# Patient Record
Sex: Female | Born: 1960 | Race: White | Hispanic: No | Marital: Married | Smoking: Never smoker
Health system: Southern US, Community
[De-identification: ages and names within clinical notes are randomized; demographics above are authoritative.]

## PROBLEM LIST (undated history)

## (undated) DIAGNOSIS — F32A Depression, unspecified: Secondary | ICD-10-CM

## (undated) DIAGNOSIS — F329 Major depressive disorder, single episode, unspecified: Secondary | ICD-10-CM

## (undated) DIAGNOSIS — T7840XA Allergy, unspecified, initial encounter: Secondary | ICD-10-CM

## (undated) DIAGNOSIS — N200 Calculus of kidney: Secondary | ICD-10-CM

## (undated) DIAGNOSIS — E785 Hyperlipidemia, unspecified: Secondary | ICD-10-CM

## (undated) HISTORY — DX: Depression, unspecified: F32.A

## (undated) HISTORY — DX: Allergy, unspecified, initial encounter: T78.40XA

## (undated) HISTORY — DX: Hyperlipidemia, unspecified: E78.5

---
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
## 1987-08-08 HISTORY — PX: OTHER SURGICAL HISTORY: SHX169

## 2009-08-07 DIAGNOSIS — N200 Calculus of kidney: Secondary | ICD-10-CM | POA: Insufficient documentation

## 2009-08-07 HISTORY — DX: Calculus of kidney: N20.0

## 2010-01-05 HISTORY — PX: COLONOSCOPY: SHX174

## 2012-08-07 HISTORY — PX: OTHER SURGICAL HISTORY: SHX169

## 2012-12-13 ENCOUNTER — Ambulatory Visit (INDEPENDENT_AMBULATORY_CARE_PROVIDER_SITE_OTHER): Payer: Self-pay | Admitting: Internal Medicine

## 2012-12-13 DIAGNOSIS — Z23 Encounter for immunization: Secondary | ICD-10-CM

## 2012-12-13 DIAGNOSIS — Z789 Other specified health status: Secondary | ICD-10-CM

## 2012-12-13 MED ORDER — AZITHROMYCIN 500 MG PO TABS: 500.0000 mg | ORAL_TABLET | Freq: Every day | ORAL | Status: DC

## 2012-12-13 MED ORDER — ATOVAQUONE-PROGUANIL HCL 250-100 MG PO TABS: 1.0000 | ORAL_TABLET | Freq: Every day | ORAL | Status: DC

## 2012-12-13 MED ORDER — TYPHOID VACCINE PO CPDR: 1.0000 | DELAYED_RELEASE_CAPSULE | ORAL | Status: DC

## 2012-12-13 NOTE — Progress Notes (Signed)
[facility] TRAVEL CLINIC NOTE  RFV: travel to [place] Subjective:    Patient ID: Laurie Rangel, female    DOB: [birth year], 52 y.o.   MRN: [id number]  HPI Ahmani going with her family to [place], from July 20th thru Aug 5th for family vacation.  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  Review of Systems     Objective:   Physical Exam        Assessment & Plan:  Provided travel counseling plus: Traveler's diarrhea = give rx for azithro 500mg  daily(#10) NR Malaria proph = malarone #26 tabs to start July 18th take daily until 1 wk return Pre travel vaccinations = she will take the oral vivotif and also receive Yellow fever

## 2012-12-27 ENCOUNTER — Encounter: Payer: Self-pay | Admitting: Internal Medicine

## 2014-02-01 DIAGNOSIS — J309 Allergic rhinitis, unspecified: Secondary | ICD-10-CM | POA: Insufficient documentation

## 2014-02-01 DIAGNOSIS — H6980 Other specified disorders of Eustachian tube, unspecified ear: Secondary | ICD-10-CM | POA: Insufficient documentation

## 2014-02-27 ENCOUNTER — Ambulatory Visit (INDEPENDENT_AMBULATORY_CARE_PROVIDER_SITE_OTHER): Payer: BC Managed Care – PPO | Admitting: Internal Medicine

## 2014-02-27 DIAGNOSIS — G4725 Circadian rhythm sleep disorder, jet lag type: Secondary | ICD-10-CM

## 2014-02-27 DIAGNOSIS — A09 Infectious gastroenteritis and colitis, unspecified: Secondary | ICD-10-CM

## 2014-02-27 DIAGNOSIS — Z9189 Other specified personal risk factors, not elsewhere classified: Secondary | ICD-10-CM

## 2014-02-27 MED ORDER — AZITHROMYCIN 500 MG PO TABS: 500.0000 mg | ORAL_TABLET | Freq: Every day | ORAL | Status: DC

## 2014-02-27 MED ORDER — ZOLPIDEM TARTRATE 5 MG PO TABS: 5.0000 mg | ORAL_TABLET | Freq: Every evening | ORAL | Status: DC | PRN

## 2014-02-27 MED ORDER — ATOVAQUONE-PROGUANIL HCL 250-100 MG PO TABS: 1.0000 | ORAL_TABLET | Freq: Every day | ORAL | Status: DC

## 2014-02-27 NOTE — Progress Notes (Signed)
   Subjective:    Patient ID: Laurie Rangel, female    DOB: 01/30/1961, 53 y.o.   MRN: [id number]  HPI Laurie Rangel is a 53yo F who traveled to [place] last year, is about to go to [place] for 10 days leaving august 23rd adn returning sept 3rd. She has done well on her past travels, no recent illness. She has also gone to [place] and not had any episode of traveler diarrhea. She did pick up medication in [place] for jetlag that is heavily sedating  Imms: uptodate    Review of Systems     Objective:   Physical Exam        Assessment & Plan:  Pre-travel vaccinations = up to date for this itinerary. No need for boosters at this time  Malaria prophylaxis = gave malarone, and precautions for mosquito bites/prevention  Traveler's diarrhea= gave rx for azithromycin and tips sheet  Jet lag = gave rx for ambien 5mg  #6, since she has tolerated in the past. Asked her to stop taking medication from [place].

## 2014-09-18 ENCOUNTER — Ambulatory Visit (INDEPENDENT_AMBULATORY_CARE_PROVIDER_SITE_OTHER): Payer: BLUE CROSS/BLUE SHIELD | Admitting: Internal Medicine

## 2014-09-18 DIAGNOSIS — Z9189 Other specified personal risk factors, not elsewhere classified: Secondary | ICD-10-CM

## 2014-09-18 MED ORDER — PROMETHAZINE HCL 25 MG PO TABS: 25.0000 mg | ORAL_TABLET | Freq: Four times a day (QID) | ORAL | Status: DC | PRN

## 2014-09-18 MED ORDER — CIPROFLOXACIN HCL 500 MG PO TABS: 500.0000 mg | ORAL_TABLET | Freq: Two times a day (BID) | ORAL | Status: DC

## 2014-09-18 MED ORDER — ATOVAQUONE-PROGUANIL HCL 250-100 MG PO TABS: 1.0000 | ORAL_TABLET | Freq: Every day | ORAL | Status: DC

## 2014-09-18 NOTE — Progress Notes (Signed)
   Subjective:    Patient ID: Laurie Rangel, female    DOB: 08/02/1961, 54 y.o.   MRN: [id number]  HPI 54yo F who has extensive travel history, who is returning to [place] in April for 10 day trip, leaving April 19 th - 29th. 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 On her last trip, she did have a bout of traveler's diarrhea that she attributes to indiscretion with food safety when she participated at a wedding reception. No one else was sick in her group.  uptodate on travel vaccine, flu  Allergies  Allergen Reactions  . Penicillins Hives   Current Outpatient Prescriptions on File Prior to Visit  Medication Sig Dispense Refill  . azithromycin (ZITHROMAX) 500 MG tablet Take 1 tablet (500 mg total) by mouth daily. If needed for 3 or more loose stools per day. Can stop if diarrhea resolves 10 tablet 0  . zolpidem (AMBIEN) 5 MG tablet Take 1 tablet (5 mg total) by mouth at bedtime as needed for sleep. 6 tablet 0   No current facility-administered medications on file prior to visit.      Review of Systems     Objective:   Physical Exam        Assessment & Plan:  Pre travel counseling  Traveler's diarrhea = gave her rx for cipro if needed, also phenergan  Malaria prophylaxis = gave her rx for malarone #21 for her trip. To start 2 days prior to travel, daily plus 7 days on return. To also use mosquito repellant and premethrin spray for clothing

## 2015-11-16 DIAGNOSIS — R6889 Other general symptoms and signs: Secondary | ICD-10-CM | POA: Diagnosis not present

## 2016-01-31 DIAGNOSIS — M859 Disorder of bone density and structure, unspecified: Secondary | ICD-10-CM | POA: Diagnosis not present

## 2016-01-31 DIAGNOSIS — Z0000 Encounter for general adult medical examination without abnormal findings: Secondary | ICD-10-CM | POA: Diagnosis not present

## 2016-02-14 DIAGNOSIS — N390 Urinary tract infection, site not specified: Secondary | ICD-10-CM | POA: Diagnosis not present

## 2016-02-14 DIAGNOSIS — Z0000 Encounter for general adult medical examination without abnormal findings: Secondary | ICD-10-CM | POA: Diagnosis not present

## 2016-02-14 DIAGNOSIS — Z1389 Encounter for screening for other disorder: Secondary | ICD-10-CM | POA: Diagnosis not present

## 2016-02-14 DIAGNOSIS — E559 Vitamin D deficiency, unspecified: Secondary | ICD-10-CM | POA: Diagnosis not present

## 2016-02-14 DIAGNOSIS — C439 Malignant melanoma of skin, unspecified: Secondary | ICD-10-CM | POA: Diagnosis not present

## 2016-02-14 DIAGNOSIS — H9012 Conductive hearing loss, unilateral, left ear, with unrestricted hearing on the contralateral side: Secondary | ICD-10-CM | POA: Diagnosis not present

## 2016-02-14 DIAGNOSIS — M859 Disorder of bone density and structure, unspecified: Secondary | ICD-10-CM | POA: Diagnosis not present

## 2016-02-17 DIAGNOSIS — Z1212 Encounter for screening for malignant neoplasm of rectum: Secondary | ICD-10-CM | POA: Diagnosis not present

## 2016-04-07 ENCOUNTER — Telehealth: Payer: Self-pay | Admitting: Gastroenterology

## 2016-04-07 NOTE — Telephone Encounter (Signed)
Received referral from Dr. Wylene Simmerisovec office for colonoscopy. Patient states last colon was in 2011 in [place]. Patient states that Dr. Wylene Simmerisovec told her that he "doesn't trust" the [place] colonoscopy report and he would like patient to be seen for another colonoscopy as soon as possible. Records placed on Dr. Larae GroomsJacob's desk for review.

## 2016-04-12 NOTE — Telephone Encounter (Signed)
Dr. Christella HartiganJacobs reviewed records and has accepted patient. Next colon 01/2020. I informed patient that we will contact her when time to schedule and to call our office if she needs us before then. Report sent to scan.

## 2016-05-01 DIAGNOSIS — H2513 Age-related nuclear cataract, bilateral: Secondary | ICD-10-CM | POA: Diagnosis not present

## 2016-05-01 DIAGNOSIS — H11153 Pinguecula, bilateral: Secondary | ICD-10-CM | POA: Diagnosis not present

## 2016-05-04 DIAGNOSIS — L578 Other skin changes due to chronic exposure to nonionizing radiation: Secondary | ICD-10-CM | POA: Diagnosis not present

## 2016-05-04 DIAGNOSIS — D1801 Hemangioma of skin and subcutaneous tissue: Secondary | ICD-10-CM | POA: Diagnosis not present

## 2016-05-04 DIAGNOSIS — D225 Melanocytic nevi of trunk: Secondary | ICD-10-CM | POA: Diagnosis not present

## 2016-05-04 DIAGNOSIS — L821 Other seborrheic keratosis: Secondary | ICD-10-CM | POA: Diagnosis not present

## 2016-10-02 DIAGNOSIS — Z1231 Encounter for screening mammogram for malignant neoplasm of breast: Secondary | ICD-10-CM | POA: Diagnosis not present

## 2016-10-02 DIAGNOSIS — Z01419 Encounter for gynecological examination (general) (routine) without abnormal findings: Secondary | ICD-10-CM | POA: Diagnosis not present

## 2016-10-02 DIAGNOSIS — Z6828 Body mass index (BMI) 28.0-28.9, adult: Secondary | ICD-10-CM | POA: Diagnosis not present

## 2016-11-30 DIAGNOSIS — L821 Other seborrheic keratosis: Secondary | ICD-10-CM | POA: Diagnosis not present

## 2016-11-30 DIAGNOSIS — L237 Allergic contact dermatitis due to plants, except food: Secondary | ICD-10-CM | POA: Diagnosis not present

## 2017-02-22 DIAGNOSIS — N390 Urinary tract infection, site not specified: Secondary | ICD-10-CM | POA: Diagnosis not present

## 2017-02-22 DIAGNOSIS — Z0000 Encounter for general adult medical examination without abnormal findings: Secondary | ICD-10-CM | POA: Diagnosis not present

## 2017-02-22 DIAGNOSIS — M859 Disorder of bone density and structure, unspecified: Secondary | ICD-10-CM | POA: Diagnosis not present

## 2017-02-22 DIAGNOSIS — E559 Vitamin D deficiency, unspecified: Secondary | ICD-10-CM | POA: Diagnosis not present

## 2017-03-02 DIAGNOSIS — Z1389 Encounter for screening for other disorder: Secondary | ICD-10-CM | POA: Diagnosis not present

## 2017-03-02 DIAGNOSIS — Z0000 Encounter for general adult medical examination without abnormal findings: Secondary | ICD-10-CM | POA: Diagnosis not present

## 2017-03-02 DIAGNOSIS — E559 Vitamin D deficiency, unspecified: Secondary | ICD-10-CM | POA: Diagnosis not present

## 2017-03-02 DIAGNOSIS — H9012 Conductive hearing loss, unilateral, left ear, with unrestricted hearing on the contralateral side: Secondary | ICD-10-CM | POA: Diagnosis not present

## 2017-03-02 DIAGNOSIS — M859 Disorder of bone density and structure, unspecified: Secondary | ICD-10-CM | POA: Diagnosis not present

## 2017-03-02 DIAGNOSIS — Z6828 Body mass index (BMI) 28.0-28.9, adult: Secondary | ICD-10-CM | POA: Diagnosis not present

## 2017-03-02 DIAGNOSIS — F331 Major depressive disorder, recurrent, moderate: Secondary | ICD-10-CM | POA: Diagnosis not present

## 2017-03-09 DIAGNOSIS — Z1212 Encounter for screening for malignant neoplasm of rectum: Secondary | ICD-10-CM | POA: Diagnosis not present

## 2017-03-16 ENCOUNTER — Ambulatory Visit (INDEPENDENT_AMBULATORY_CARE_PROVIDER_SITE_OTHER): Payer: BLUE CROSS/BLUE SHIELD | Admitting: Internal Medicine

## 2017-03-16 DIAGNOSIS — Z9189 Other specified personal risk factors, not elsewhere classified: Secondary | ICD-10-CM

## 2017-03-16 DIAGNOSIS — A09 Infectious gastroenteritis and colitis, unspecified: Secondary | ICD-10-CM

## 2017-03-16 MED ORDER — DOXYCYCLINE HYCLATE 100 MG PO TABS: 100.0000 mg | ORAL_TABLET | Freq: Every day | ORAL | 0 refills | Status: DC

## 2017-03-16 MED ORDER — AZITHROMYCIN 500 MG PO TABS: 500.0000 mg | ORAL_TABLET | Freq: Every day | ORAL | 0 refills | Status: DC

## 2017-03-16 MED ORDER — ATOVAQUONE-PROGUANIL HCL 250-100 MG PO TABS: 1.0000 | ORAL_TABLET | Freq: Every day | ORAL | 0 refills | Status: DC

## 2017-03-16 NOTE — Progress Notes (Signed)
Subjective:   Lindaann SloughBetty Palmer is a 56 y.o. female who presents to the Infectious Disease clinic for travel consultation. Planned departure date: Sept -end Planned return date: 3 day trip Countries of travel: [place] Areas in country: urban and rural   Accommodations: hotel Purpose of travel: philanthropy Prior travel out of [place]: [place], [place], [place]     Objective:   Medications: promethazine    Assessment:   No contraindications to travel. none   Plan:    pre travel vaccinations are uptodate  Malaria proph = recommended malarone, also gave rx of doxycycline if she could not tolerate malarone, to fill while in [place]  Traveler's diarrhea = gave rx for azithromcyin to use if needed  Mosquito bite prevention = gave recs for deet, and premethrin

## 2017-03-24 DIAGNOSIS — N390 Urinary tract infection, site not specified: Secondary | ICD-10-CM | POA: Diagnosis not present

## 2017-03-29 DIAGNOSIS — Z1211 Encounter for screening for malignant neoplasm of colon: Secondary | ICD-10-CM | POA: Diagnosis not present

## 2017-03-29 DIAGNOSIS — Z1212 Encounter for screening for malignant neoplasm of rectum: Secondary | ICD-10-CM | POA: Diagnosis not present

## 2017-04-12 DIAGNOSIS — N390 Urinary tract infection, site not specified: Secondary | ICD-10-CM | POA: Diagnosis not present

## 2017-05-15 DIAGNOSIS — L578 Other skin changes due to chronic exposure to nonionizing radiation: Secondary | ICD-10-CM | POA: Diagnosis not present

## 2017-05-15 DIAGNOSIS — L821 Other seborrheic keratosis: Secondary | ICD-10-CM | POA: Diagnosis not present

## 2017-05-15 DIAGNOSIS — D1801 Hemangioma of skin and subcutaneous tissue: Secondary | ICD-10-CM | POA: Diagnosis not present

## 2017-05-15 DIAGNOSIS — D225 Melanocytic nevi of trunk: Secondary | ICD-10-CM | POA: Diagnosis not present

## 2017-06-26 DIAGNOSIS — R21 Rash and other nonspecific skin eruption: Secondary | ICD-10-CM | POA: Diagnosis not present

## 2017-07-19 DIAGNOSIS — D485 Neoplasm of uncertain behavior of skin: Secondary | ICD-10-CM | POA: Diagnosis not present

## 2017-07-19 DIAGNOSIS — L859 Epidermal thickening, unspecified: Secondary | ICD-10-CM | POA: Diagnosis not present

## 2017-10-09 DIAGNOSIS — D485 Neoplasm of uncertain behavior of skin: Secondary | ICD-10-CM | POA: Diagnosis not present

## 2017-10-09 DIAGNOSIS — L905 Scar conditions and fibrosis of skin: Secondary | ICD-10-CM | POA: Diagnosis not present

## 2017-11-05 DIAGNOSIS — R300 Dysuria: Secondary | ICD-10-CM | POA: Diagnosis not present

## 2018-01-02 DIAGNOSIS — Z6828 Body mass index (BMI) 28.0-28.9, adult: Secondary | ICD-10-CM | POA: Diagnosis not present

## 2018-01-02 DIAGNOSIS — Z1231 Encounter for screening mammogram for malignant neoplasm of breast: Secondary | ICD-10-CM | POA: Diagnosis not present

## 2018-01-02 DIAGNOSIS — Z01419 Encounter for gynecological examination (general) (routine) without abnormal findings: Secondary | ICD-10-CM | POA: Diagnosis not present

## 2018-03-04 DIAGNOSIS — Z0000 Encounter for general adult medical examination without abnormal findings: Secondary | ICD-10-CM | POA: Diagnosis not present

## 2018-03-04 DIAGNOSIS — R82998 Other abnormal findings in urine: Secondary | ICD-10-CM | POA: Diagnosis not present

## 2018-03-04 DIAGNOSIS — E559 Vitamin D deficiency, unspecified: Secondary | ICD-10-CM | POA: Diagnosis not present

## 2018-03-11 DIAGNOSIS — M859 Disorder of bone density and structure, unspecified: Secondary | ICD-10-CM | POA: Diagnosis not present

## 2018-03-11 DIAGNOSIS — Z1331 Encounter for screening for depression: Secondary | ICD-10-CM | POA: Diagnosis not present

## 2018-03-11 DIAGNOSIS — F331 Major depressive disorder, recurrent, moderate: Secondary | ICD-10-CM | POA: Diagnosis not present

## 2018-03-11 DIAGNOSIS — Z0000 Encounter for general adult medical examination without abnormal findings: Secondary | ICD-10-CM | POA: Diagnosis not present

## 2018-03-11 DIAGNOSIS — E559 Vitamin D deficiency, unspecified: Secondary | ICD-10-CM | POA: Diagnosis not present

## 2018-03-11 DIAGNOSIS — H9012 Conductive hearing loss, unilateral, left ear, with unrestricted hearing on the contralateral side: Secondary | ICD-10-CM | POA: Diagnosis not present

## 2018-03-13 DIAGNOSIS — Z1212 Encounter for screening for malignant neoplasm of rectum: Secondary | ICD-10-CM | POA: Diagnosis not present

## 2018-05-22 DIAGNOSIS — Z23 Encounter for immunization: Secondary | ICD-10-CM | POA: Diagnosis not present

## 2018-08-22 DIAGNOSIS — J101 Influenza due to other identified influenza virus with other respiratory manifestations: Secondary | ICD-10-CM | POA: Diagnosis not present

## 2019-01-09 ENCOUNTER — Ambulatory Visit (INDEPENDENT_AMBULATORY_CARE_PROVIDER_SITE_OTHER): Payer: BC Managed Care – PPO | Admitting: Family Medicine

## 2019-01-09 ENCOUNTER — Encounter: Payer: Self-pay | Admitting: Family Medicine

## 2019-01-09 ENCOUNTER — Other Ambulatory Visit: Payer: Self-pay

## 2019-01-09 ENCOUNTER — Ambulatory Visit: Payer: Self-pay

## 2019-01-09 VITALS — BP 128/71 | HR 84 | Ht 63.0 in | Wt 155.0 lb

## 2019-01-09 DIAGNOSIS — M79605 Pain in left leg: Secondary | ICD-10-CM
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
## 2019-01-09 NOTE — Patient Instructions (Signed)
Nice to meet you Please try icing the area  You can try over the counter voltaren gel  Please take vitamin D and calcium   Please send me a message in MyChart with any questions or updates.  Please see me back if no better.   --Dr. Jordan Likes

## 2019-02-17 ENCOUNTER — Encounter: Payer: Self-pay | Admitting: Family Medicine

## 2019-02-17 ENCOUNTER — Ambulatory Visit: Payer: Self-pay

## 2019-02-17 ENCOUNTER — Ambulatory Visit (INDEPENDENT_AMBULATORY_CARE_PROVIDER_SITE_OTHER): Payer: BC Managed Care – PPO | Admitting: Family Medicine

## 2019-02-17 ENCOUNTER — Other Ambulatory Visit: Payer: Self-pay

## 2019-02-17 ENCOUNTER — Ambulatory Visit (HOSPITAL_BASED_OUTPATIENT_CLINIC_OR_DEPARTMENT_OTHER)
Admission: RE | Admit: 2019-02-17 | Discharge: 2019-02-17 | Disposition: A | Discharge status: Home / Self Care | Payer: BC Managed Care – PPO | Source: Ambulatory Visit | Attending: Family Medicine | Admitting: Family Medicine

## 2019-02-17 VITALS — BP 115/77 | HR 81 | Ht 63.0 in | Wt 160.0 lb

## 2019-02-17 DIAGNOSIS — T148XXA Other injury of unspecified body region, initial encounter: Secondary | ICD-10-CM | POA: Diagnosis not present

## 2019-02-17 DIAGNOSIS — S83411A Sprain of medial collateral ligament of right knee, initial encounter: Secondary | ICD-10-CM | POA: Diagnosis not present

## 2019-02-17 DIAGNOSIS — M25561 Pain in right knee: Secondary | ICD-10-CM

## 2019-02-17 HISTORY — DX: Other injury of unspecified body region, initial encounter: T14.8XXA

## 2019-02-17 NOTE — Assessment & Plan Note (Signed)
Appearing over the anterior proximal tibia.  Does not appear to be associated with a patellar tendon rupture or fracture. -Counseled supportive care. -Monitor for infection.

## 2019-02-17 NOTE — Patient Instructions (Signed)
Good to see you Please try the ace wrap  Please use ice  Please try ibuprofen or tylenol  Please try the exercises  I will call with the results from today   Please send me a message in MyChart with any questions or updates.  Please see me back in 3 weeks.   --Dr. Raeford Razor
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
## 2019-02-17 NOTE — Assessment & Plan Note (Signed)
Appears at the origin of the MCL has increased vascularity and swelling.  Does not appear to involve the meniscus at this time as she has no effusion in the knee. -X-ray. -Counseled on home exercise therapy and supportive care. -Provided Ace wrap. -Follow-up in 3 weeks.  Consider physical therapy

## 2019-02-18 ENCOUNTER — Telehealth: Payer: Self-pay | Admitting: Family Medicine

## 2019-02-18 NOTE — Telephone Encounter (Signed)
Informed of xray results.   Rosemarie Ax, MD [facility] 02/18/2019, 8:48 AM

## 2019-03-07 DIAGNOSIS — Z0000 Encounter for general adult medical examination without abnormal findings: Secondary | ICD-10-CM | POA: Diagnosis not present

## 2019-03-07 DIAGNOSIS — E7800 Pure hypercholesterolemia, unspecified: Secondary | ICD-10-CM | POA: Diagnosis not present

## 2019-03-07 DIAGNOSIS — E559 Vitamin D deficiency, unspecified: Secondary | ICD-10-CM | POA: Diagnosis not present

## 2019-03-10 DIAGNOSIS — R82998 Other abnormal findings in urine: Secondary | ICD-10-CM | POA: Diagnosis not present

## 2019-03-11 LAB — CBC AND DIFFERENTIAL
HCT: 43 (ref 36–46)
Hemoglobin: 14.2 (ref 12.0–16.0)
Platelets: 248 (ref 150–399)
WBC: 4.3

## 2019-03-11 LAB — BASIC METABOLIC PANEL
BUN: 15 (ref 4–21)
Creatinine: 0.8 (ref 0.5–1.1)
Glucose: 109
Potassium: 4.3 (ref 3.4–5.3)
Sodium: 142 (ref 137–147)

## 2019-03-11 LAB — VITAMIN D 25 HYDROXY (VIT D DEFICIENCY, FRACTURES): Vit D, 25-Hydroxy: 37

## 2019-03-11 LAB — LIPID PANEL
Cholesterol: 193 (ref 0–200)
HDL: 38 (ref 35–70)
LDL Cholesterol: 107
Triglycerides: 238 — AB (ref 40–160)

## 2019-03-11 LAB — HEPATIC FUNCTION PANEL
ALT: 23 (ref 7–35)
AST: 20 (ref 13–35)
Alkaline Phosphatase: 88 (ref 25–125)
Bilirubin, Total: 0.4

## 2019-03-14 DIAGNOSIS — N200 Calculus of kidney: Secondary | ICD-10-CM | POA: Diagnosis not present

## 2019-03-14 DIAGNOSIS — Z1331 Encounter for screening for depression: Secondary | ICD-10-CM | POA: Diagnosis not present

## 2019-03-14 DIAGNOSIS — J301 Allergic rhinitis due to pollen: Secondary | ICD-10-CM | POA: Diagnosis not present

## 2019-03-14 DIAGNOSIS — G43909 Migraine, unspecified, not intractable, without status migrainosus: Secondary | ICD-10-CM | POA: Diagnosis not present

## 2019-03-14 DIAGNOSIS — E7800 Pure hypercholesterolemia, unspecified: Secondary | ICD-10-CM | POA: Diagnosis not present

## 2019-03-14 DIAGNOSIS — Z0000 Encounter for general adult medical examination without abnormal findings: Secondary | ICD-10-CM | POA: Diagnosis not present

## 2019-04-09 DIAGNOSIS — J00 Acute nasopharyngitis [common cold]: Secondary | ICD-10-CM | POA: Diagnosis not present

## 2019-04-09 DIAGNOSIS — Z1159 Encounter for screening for other viral diseases: Secondary | ICD-10-CM | POA: Diagnosis not present

## 2019-04-22 DIAGNOSIS — R109 Unspecified abdominal pain: Secondary | ICD-10-CM | POA: Diagnosis not present

## 2019-04-22 DIAGNOSIS — J0100 Acute maxillary sinusitis, unspecified: Secondary | ICD-10-CM | POA: Diagnosis not present

## 2019-04-22 DIAGNOSIS — N390 Urinary tract infection, site not specified: Secondary | ICD-10-CM | POA: Diagnosis not present

## 2019-04-22 DIAGNOSIS — R05 Cough: Secondary | ICD-10-CM | POA: Diagnosis not present

## 2019-04-23 ENCOUNTER — Other Ambulatory Visit: Payer: Self-pay

## 2019-04-23 ENCOUNTER — Other Ambulatory Visit: Payer: Self-pay | Admitting: Internal Medicine

## 2019-04-23 ENCOUNTER — Ambulatory Visit
Admission: RE | Admit: 2019-04-23 | Discharge: 2019-04-23 | Disposition: A | Discharge status: Home / Self Care | Payer: BC Managed Care – PPO | Source: Ambulatory Visit | Attending: Internal Medicine | Admitting: Internal Medicine

## 2019-04-23 DIAGNOSIS — R109 Unspecified abdominal pain: Secondary | ICD-10-CM

## 2019-04-23 DIAGNOSIS — N200 Calculus of kidney: Secondary | ICD-10-CM | POA: Diagnosis not present

## 2019-04-23 DIAGNOSIS — R140 Abdominal distension (gaseous): Secondary | ICD-10-CM | POA: Diagnosis not present

## 2019-04-23 DIAGNOSIS — K760 Fatty (change of) liver, not elsewhere classified: Secondary | ICD-10-CM | POA: Diagnosis not present

## 2019-04-23 MED ORDER — IOPAMIDOL (ISOVUE-300) INJECTION 61%
100.0000 mL | Freq: Once | INTRAVENOUS | Status: AC | PRN
  Administered 2019-04-23: 100 mL via INTRAVENOUS

## 2019-04-29 ENCOUNTER — Encounter: Payer: Self-pay | Admitting: Gastroenterology

## 2019-05-02 ENCOUNTER — Other Ambulatory Visit: Payer: Self-pay

## 2019-05-02 ENCOUNTER — Encounter: Payer: Self-pay | Admitting: Family Medicine

## 2019-05-02 ENCOUNTER — Ambulatory Visit (INDEPENDENT_AMBULATORY_CARE_PROVIDER_SITE_OTHER): Payer: BC Managed Care – PPO | Admitting: Family Medicine

## 2019-05-02 VITALS — BP 110/70 | HR 87 | Temp 98.5°F | Ht 63.0 in | Wt 166.4 lb

## 2019-05-02 DIAGNOSIS — R1032 Left lower quadrant pain: Secondary | ICD-10-CM | POA: Diagnosis not present

## 2019-05-02 DIAGNOSIS — R1031 Right lower quadrant pain: Secondary | ICD-10-CM

## 2019-05-02 DIAGNOSIS — R140 Abdominal distension (gaseous): Secondary | ICD-10-CM

## 2019-05-02 DIAGNOSIS — Z7689 Persons encountering health services in other specified circumstances: Secondary | ICD-10-CM

## 2019-05-02 NOTE — Progress Notes (Signed)
Laurie Rangel is a 58 y.o. female  Chief Complaint  Patient presents with  . Establish Care    est care/     HPI: Laurie Rangel is a 58 y.o. female here to establish care with our office. Previous PCP Dr. Wylene Simmer x 5 years.   Pt has a PMHx significant for depression, for which she takes lexapro 5mg  daily, and hypercholesterolemia, for which she takes zocor 20mg  daily.   She is currently part of a vaccine trial and therefore cannot receive flu or other vaccines.   She complains of lower abdominal bloating and discomfort x 2 mo or so. Nonspecific dull ache but can have "sharper pain" with some, not consistent movements. Pain is not keeping pt up at night or preventing her from sleeping. She does feel like she is moving more slowly and cautiously as she is concerned about causing or exacerbating the  pain. She does get pain with running. Discomfort can be positional. Normal BM - no diarrhea or constipation. She endorses decreased appetite but no early satiety. Bloating and pain not related to meals/food. 1 mo ago had 1 week episode of increased flatulence but this resolved. No increased physical activity or trauma. She has upcoming appt with GI. Last colo in 2011 and due for repeat in 01/2020. She had CT abd/pelvis done earlier this month that was normal/negative, other than finding of hepatic steatosis and small pulm nodule. UA was negative (done with previous PCP)  No urinary symptoms. No dysuria, urgency, frequency, gross hematuria. Pt is not a current or former smoker.  No fever, chills.   No vaginal itching, odor, pain, abnormal bleeding. She has upcoming GYN appt on Monday for 02/2020 and then 9/30 for PAP.  Pt has gained 11lbs since 01/2019. Prior to covid she was going to gyn 3x/wk x 1 hr.  Wt Readings from Last 3 Encounters:  05/02/19 166 lb 6.4 oz (75.5 kg)  02/17/19 160 lb (72.6 kg)  01/09/19 155 lb (70.3 kg)   Specialists: sports med (Dr. 02/19/19), ID (Dr. 03/11/19) - for travel  consultations/prophylaxis, upcoming urogyn appt (Dr. Jordan Likes), upcoming appt with Dr. Judyann Munson [facility] on 06/03/19.  Last CPE, labs: 02/2019 - labs abstracted and will scan copy into Epic  Last PAP: scheduled next week Last mammo: scheduled next week Last colonoscopy: last in 2011; due in 01/2020    Past Medical History:  Diagnosis Date  . Depression   . Hyperlipidemia     History reviewed. No pertinent surgical history.  Social History   Socioeconomic History  . Marital status: Married    Spouse name: Not on file  . Number of children: Not on file  . Years of education: Not on file  . Highest education level: Not on file  Occupational History  . Not on file  Social Needs  . Financial resource strain: Not on file  . Food insecurity    Worry: Not on file    Inability: Not on file  . Transportation needs    Medical: Not on file    Non-medical: Not on file  Tobacco Use  . Smoking status: Never Smoker  . Smokeless tobacco: Never Used  Substance and Sexual Activity  . Alcohol use: Yes    Comment: social  . Drug use: Never  . Sexual activity: Not on file  Lifestyle  . Physical activity    Days per week: Not on file    Minutes per session: Not on file  . Stress: Not  on file  Relationships  . Social Musicianconnections    Talks on phone: Not on file    Gets together: Not on file    Attends religious service: Not on file    Active member of club or organization: Not on file    Attends meetings of clubs or organizations: Not on file    Relationship status: Not on file  . Intimate partner violence    Fear of current or ex partner: Not on file    Emotionally abused: Not on file    Physically abused: Not on file    Forced sexual activity: Not on file  Other Topics Concern  . Not on file  Social History Narrative  . Not on file    Family History  Problem Relation Age of Onset  . Hypertension Mother   . Heart disease Mother   . Diabetes Father      Immunization  History  Administered Date(s) Administered  . DTP 08/17/2010  . Hep A / Hep B 10/15/2001, 12/02/2001, 04/21/2002  . Hepatitis B 03/29/2006  . Influenza,inj,Quad PF,6+ Mos 05/16/2012  . Typhoid Live 12/13/2012  . Typhoid Parenteral 08/17/2010  . Yellow Fever 12/13/2012    Outpatient Encounter Medications as of 05/02/2019  Medication Sig  . atovaquone-proguanil (MALARONE) 250-100 MG TABS tablet Take 1 tablet by mouth daily. Start 2 days prior to travel, take daily with food  . escitalopram (LEXAPRO) 5 MG tablet Take 5 mg by mouth daily.  Marland Kitchen. omeprazole (PRILOSEC) 20 MG capsule Take 20 mg by mouth daily.  . simvastatin (ZOCOR) 20 MG tablet Take 20 mg by mouth daily.  . promethazine (PHENERGAN) 25 MG tablet Take 1 tablet (25 mg total) by mouth every 6 (six) hours as needed for nausea or vomiting. (Patient not taking: Reported on 05/02/2019)  . zolpidem (AMBIEN) 5 MG tablet Take 1 tablet (5 mg total) by mouth at bedtime as needed for sleep. (Patient not taking: Reported on 05/02/2019)  . [DISCONTINUED] atovaquone-proguanil (MALARONE) 250-100 MG TABS Take 1 tablet by mouth daily. Start 2 days prior to leaving for [place], take 1 tab daily until complete  . [DISCONTINUED] azithromycin (ZITHROMAX) 500 MG tablet Take 1 tablet (500 mg total) by mouth daily. If needed for 3 or more loose stools per day. Can stop if diarrhea resolves  . [DISCONTINUED] ciprofloxacin (CIPRO) 500 MG tablet Take 1 tablet (500 mg total) by mouth 2 (two) times daily. If you have 3+loose stools/24hr. Can stop taking if diarrhea resolves  . [DISCONTINUED] doxycycline (VIBRA-TABS) 100 MG tablet Take 1 tablet (100 mg total) by mouth daily. Take until complete for malaria prevention   No facility-administered encounter medications on file as of 05/02/2019.      ROS: Pertinent positives and negatives noted in HPI. Remainder of ROS non-contributory  Allergies  Allergen Reactions  . Penicillins Hives    BP 110/70   Pulse 87    Temp 98.5 F (36.9 C) (Oral)   Ht 5\' 3"  (1.6 m)   Wt 166 lb 6.4 oz (75.5 kg)   SpO2 95%   BMI 29.48 kg/m   Physical Exam  Constitutional: She is oriented to person, place, and time. She appears well-developed and well-nourished. No distress.  Cardiovascular: Normal rate and regular rhythm.  Pulmonary/Chest: Effort normal and breath sounds normal.  Abdominal: Soft. Bowel sounds are normal. She exhibits no distension and no mass. There is no abdominal tenderness. There is no rebound and no guarding.  Genitourinary:    Genitourinary Comments:  Deferred as pt has appt with GYN in 3 days   Musculoskeletal:        General: No edema.  Neurological: She is alert and oriented to person, place, and time.  Psychiatric: Her behavior is normal. Thought content normal.  Pt appears anxious     A/P:  1. Encounter to establish care with new doctor - UTD on CPE, labs - has GYN, mammo appts scheduled - colonoscopy UTD - due 01/2020 - in vaccine trial currently so pt cannot receive vaccines  2. Bilateral lower abdominal discomfort 3. Abdominal bloating - symptoms x 2 mo - no urinary symptoms and normal UA - CT abd/pelvis - negative - appt with GYN 9/28 for pelvic US, PAP - appt scheduled with GI on 06/03/19 - could be IBS vs SIBO (however location of discomfort/bloating is in lower abdomen) vs ovarian or other pelvic pathology (although normal CT) vs other - advised pt to keep upcoming GYN and GI appts - will contact GI to see if pt is able to be seen sooner than her scheduled appt in 1 mo  I personally spent 30 min with the patient today and greater than 50% was spent in counseling, coordination of care, education

## 2019-05-05 ENCOUNTER — Telehealth: Payer: Self-pay

## 2019-05-05 DIAGNOSIS — M549 Dorsalgia, unspecified: Secondary | ICD-10-CM | POA: Diagnosis not present

## 2019-05-05 DIAGNOSIS — R140 Abdominal distension (gaseous): Secondary | ICD-10-CM | POA: Diagnosis not present

## 2019-05-05 DIAGNOSIS — R109 Unspecified abdominal pain: Secondary | ICD-10-CM | POA: Diagnosis not present

## 2019-05-05 NOTE — Telephone Encounter (Signed)
The pt has been scheduled for 05/16/19 at 2:50 pm The patient has been notified of this information and all questions answered.

## 2019-05-05 NOTE — Telephone Encounter (Signed)
-----   Message from Milus Banister, MD sent at 05/05/2019  8:19 AM EDT ----- Regarding: RE: New pt appt I'm sure we can get her in sooner.  Thanks  [employer], Can you find an afternoon spot in the next 1-2 weeks for her, double book if needed.  Thanks  ----- Message ----- From: Ronnald Nian, DO Sent: 05/02/2019  12:38 PM EDT To: Milus Banister, MD Subject: New pt appt                                    Hi Dan,  This patient established care with me today and has a new patient appt with you in about 1 mo. She is very nice, also very anxious about her symptoms. Is there any chance you may be able to see her sooner than her currently scheduled appt? I understand how busy you are, but promised her I would reach out to ask.  My note from today's encounter has more info but she complains of lower abdominal discomfort/bloating x 2 mo. No change in bowel habits, hematochezia, fever, chills, n/v, reflux. Previous PCP checked urine (normal) and pt has CT abd/pelvis (report in Epic) done which was negative other than incidental finding of hepatic steatosis. Last colonoscopy was 2011 and she is due for repeat in 01/2020. She has appt with GYN on Monday 9/28. Let me know if I can provide any more info.  Thanks so much, [facility]

## 2019-05-07 DIAGNOSIS — Z1231 Encounter for screening mammogram for malignant neoplasm of breast: Secondary | ICD-10-CM | POA: Diagnosis not present

## 2019-05-07 DIAGNOSIS — Z01419 Encounter for gynecological examination (general) (routine) without abnormal findings: Secondary | ICD-10-CM | POA: Diagnosis not present

## 2019-05-07 DIAGNOSIS — Z1151 Encounter for screening for human papillomavirus (HPV): Secondary | ICD-10-CM | POA: Diagnosis not present

## 2019-05-07 DIAGNOSIS — Z6829 Body mass index (BMI) 29.0-29.9, adult: Secondary | ICD-10-CM | POA: Diagnosis not present

## 2019-05-07 LAB — HM MAMMOGRAPHY

## 2019-05-08 ENCOUNTER — Encounter: Payer: Self-pay | Admitting: Family Medicine

## 2019-05-08 DIAGNOSIS — M6281 Muscle weakness (generalized): Secondary | ICD-10-CM | POA: Diagnosis not present

## 2019-05-08 DIAGNOSIS — M62838 Other muscle spasm: Secondary | ICD-10-CM | POA: Diagnosis not present

## 2019-05-08 DIAGNOSIS — K5900 Constipation, unspecified: Secondary | ICD-10-CM | POA: Diagnosis not present

## 2019-05-08 DIAGNOSIS — M545 Low back pain: Secondary | ICD-10-CM | POA: Diagnosis not present

## 2019-05-16 ENCOUNTER — Encounter: Payer: Self-pay | Admitting: Gastroenterology

## 2019-05-16 ENCOUNTER — Other Ambulatory Visit: Payer: Self-pay

## 2019-05-16 ENCOUNTER — Ambulatory Visit (INDEPENDENT_AMBULATORY_CARE_PROVIDER_SITE_OTHER): Payer: BC Managed Care – PPO | Admitting: Gastroenterology

## 2019-05-16 VITALS — BP 122/74 | HR 81 | Temp 97.2°F | Ht 63.0 in | Wt 166.2 lb

## 2019-05-16 DIAGNOSIS — R1030 Lower abdominal pain, unspecified: Secondary | ICD-10-CM | POA: Diagnosis not present

## 2019-05-16 MED ORDER — PEG 3350-KCL-NA BICARB-NACL 420 G PO SOLR: 4000.0000 mL | ORAL | 0 refills | Status: DC

## 2019-05-16 NOTE — Patient Instructions (Signed)
You have been scheduled for a colonoscopy. Please follow written instructions given to you at your visit today.  Please pick up your prep supplies at the pharmacy within the next 1-3 days. If you use inhalers (even only as needed), please bring them with you on the day of your procedure. Your physician has requested that you go to www.startemmi.com and enter the access code given to you at your visit today. This web site gives a general overview about your procedure. However, you should still follow specific instructions given to you by our office regarding your preparation for the procedure.  Thank you for entrusting me with your care and choosing [facility].  Dr Jacobs  

## 2019-05-16 NOTE — Progress Notes (Signed)
HPI: This is a very pleasant 58 year old woman who was referred to me by Dr. Bryan Lemma, [facility]   Chief complaint is lower abdominal pains  She has had a "vague achiness" in her lower abdomen for several months.  Can sometimes radiate to her back and sometimes down her legs as well.  Will sometimes make it hard for her to walk.  She is undergone lab tests, imaging studies as below.  She is also undergone a pelvic ultrasound which she tells me was normal.  She has noticed no changes in her bowels.  Eating and having bowel movements does not cause any thing about the symptoms to get worse or better.  The achiness is intermittent.  Occurs about twice per week.  Generally worse in the morning and better throughout the day through no specific cause that she can tell.  Colon cancer does not run in her family.  She does not see blood in her stool.  She has probably gained between 15 and 20 pounds in the past 6 or 8 months  Old Data Reviewed: I have never met her before but I reviewed previous colonoscopy at one point, see documentation here in epic.  She underwent a colonoscopy June 2011 in [place].  I reviewed the report.  It looks like a very good quality colonoscopy from what I can tell and I recommended that she have a repeat colonoscopy at 10-year interval which would be June 2021.  She had hemorrhoids.  The terminal ileum was normal.  The prep was good.  No polyps were noted.  CT scan abdomen pelvis with IV and oral contrast September 2020 for "lower abdominal pain bilaterally with bloating and swelling, x1 month, denies bowel or bladder changes" findings no CT findings of the abdomen or pelvis to explain pain, bloating or swelling.  Hepatic steatosis.  Right kidney stone.  Incidental pulmonary nodule.  Blood work September 2020 urinalysis was normal, CBC and complete metabolic profile were also normal.    Review of systems: Pertinent positive and negative review of systems were noted in the  above HPI section. All other review negative.   Past Medical History:  Diagnosis Date  . Depression   . Hyperlipidemia     Past Surgical History:  Procedure Laterality Date  . COLONOSCOPY  01/2010   in [place]    Current Outpatient Medications  Medication Sig Dispense Refill  . Cholecalciferol (VITAMIN D3 SUPER STRENGTH) 50 MCG (2000 UT) CAPS Take by mouth.    . escitalopram (LEXAPRO) 5 MG tablet Take 5 mg by mouth daily.    . Multiple Vitamins-Minerals (CENTRUM SILVER ULTRA WOMENS PO) Take by mouth.    . Multiple Vitamins-Minerals (PRESERVISION AREDS 2+MULTI VIT PO) Take by mouth.    . simvastatin (ZOCOR) 20 MG tablet Take 20 mg by mouth daily.    Marland Kitchen omeprazole (PRILOSEC) 20 MG capsule Take 20 mg by mouth daily.    Marland Kitchen zolpidem (AMBIEN) 5 MG tablet Take 1 tablet (5 mg total) by mouth at bedtime as needed for sleep. (Patient not taking: Reported on 05/16/2019) 6 tablet 0   No current facility-administered medications for this visit.     Allergies as of 05/16/2019 - Review Complete 05/16/2019  Allergen Reaction Noted  . Penicillins Hives 02/27/2014    Family History  Problem Relation Age of Onset  . Hypertension Mother   . Heart disease Mother   . Diabetes Father     Social History   Socioeconomic History  .  Marital status: Married    Spouse name: Not on file  . Number of children: Not on file  . Years of education: Not on file  . Highest education level: Not on file  Occupational History  . Not on file  Social Needs  . Financial resource strain: Not on file  . Food insecurity    Worry: Not on file    Inability: Not on file  . Transportation needs    Medical: Not on file    Non-medical: Not on file  Tobacco Use  . Smoking status: Never Smoker  . Smokeless tobacco: Never Used  Substance and Sexual Activity  . Alcohol use: Yes    Comment: social  . Drug use: Never  . Sexual activity: Not on file  Lifestyle  . Physical activity    Days per week: Not on  file    Minutes per session: Not on file  . Stress: Not on file  Relationships  . Social Herbalist on phone: Not on file    Gets together: Not on file    Attends religious service: Not on file    Active member of club or organization: Not on file    Attends meetings of clubs or organizations: Not on file    Relationship status: Not on file  . Intimate partner violence    Fear of current or ex partner: Not on file    Emotionally abused: Not on file    Physically abused: Not on file    Forced sexual activity: Not on file  Other Topics Concern  . Not on file  Social History Narrative  . Not on file     Physical Exam: BP 122/74   Pulse 81   Temp (!) 97.2 F (36.2 C)   Ht 5' 3"  (1.6 m)   Wt 166 lb 3.2 oz (75.4 kg)   BMI 29.44 kg/m  Constitutional: generally well-appearing Psychiatric: alert and oriented x3 Eyes: extraocular movements intact Mouth: oral pharynx moist, no lesions Neck: supple no lymphadenopathy Cardiovascular: heart regular rate and rhythm Lungs: clear to auscultation bilaterally Abdomen: soft, nontender, nondistended, no obvious ascites, no peritoneal signs, normal bowel sounds Extremities: no lower extremity edema bilaterally Skin: no lesions on visible extremities   Assessment and plan: 58 y.o. female with vague lower abdominal pains  I think this is unlikely from anything serious.  She has undergone blood tests, CT scan, pelvic ultrasound which were all essentially normal.  Her last colon examination was 9 years ago I recommend we repeat that now to exclude other potential causes such as neoplasm which I think is unlikely.   Please see the "Patient Instructions" section for addition details about the plan.   Owens Loffler, MD [facility] 05/16/2019, 3:11 PM  Cc: Haywood Pao, MD

## 2019-05-19 ENCOUNTER — Encounter: Payer: Self-pay | Admitting: Gastroenterology

## 2019-05-22 ENCOUNTER — Telehealth: Payer: Self-pay

## 2019-05-22 ENCOUNTER — Telehealth: Payer: Self-pay | Admitting: Gastroenterology

## 2019-05-22 DIAGNOSIS — M6281 Muscle weakness (generalized): Secondary | ICD-10-CM | POA: Diagnosis not present

## 2019-05-22 DIAGNOSIS — M545 Low back pain: Secondary | ICD-10-CM | POA: Diagnosis not present

## 2019-05-22 DIAGNOSIS — K5900 Constipation, unspecified: Secondary | ICD-10-CM | POA: Diagnosis not present

## 2019-05-22 DIAGNOSIS — M62838 Other muscle spasm: Secondary | ICD-10-CM | POA: Diagnosis not present

## 2019-05-22 MED ORDER — PEG 3350-KCL-NA BICARB-NACL 420 G PO SOLR: 4000.0000 mL | Freq: Once | ORAL | 0 refills | Status: AC

## 2019-05-22 NOTE — Addendum Note (Signed)
Addended by: Steva Ready on: 05/22/2019 10:58 AM   Modules accepted: Orders

## 2019-05-22 NOTE — Telephone Encounter (Signed)
Pt is scheduled for a colon tomorrow 05/23/19 and stated that pharmacy did not receive prep script.  Please resend.

## 2019-05-22 NOTE — Telephone Encounter (Signed)
Covid-19 screening questions   Do you now or have you had a fever in the last 14 days? NO   Do you have any respiratory symptoms of shortness of breath or cough now or in the last 14 days? NO  Do you have any family members or close contacts with diagnosed or suspected Covid-19 in the past 14 days? NO  Have you been tested for Covid-19 and found to be positive? NO        

## 2019-05-22 NOTE — Telephone Encounter (Signed)
Called pt and verified [facility] in [place] and resent [facility] prep - pt aware Lelan Pons [facility]

## 2019-05-22 NOTE — Telephone Encounter (Signed)
Pt called in saying her prep script was not received . Called her pharmacy and verified they did not receive this order. Spoke with pre visit nurse Willette Pa ,who will resend prep order and called pt back and notified her.

## 2019-05-23 ENCOUNTER — Ambulatory Visit (AMBULATORY_SURGERY_CENTER): Payer: BC Managed Care – PPO | Admitting: Gastroenterology

## 2019-05-23 ENCOUNTER — Other Ambulatory Visit: Payer: Self-pay

## 2019-05-23 ENCOUNTER — Encounter: Payer: Self-pay | Admitting: Gastroenterology

## 2019-05-23 DIAGNOSIS — N200 Calculus of kidney: Secondary | ICD-10-CM

## 2019-05-23 DIAGNOSIS — R1032 Left lower quadrant pain: Secondary | ICD-10-CM | POA: Diagnosis not present

## 2019-05-23 DIAGNOSIS — R1084 Generalized abdominal pain: Secondary | ICD-10-CM

## 2019-05-23 NOTE — Progress Notes (Signed)
Report given to PACU, vss 

## 2019-05-23 NOTE — Progress Notes (Signed)
Temperature taken by K.A., VS taken by C.W. 

## 2019-05-23 NOTE — Op Note (Signed)
[facility] Patient Name: Laurie Rangel Procedure Date: 05/23/2019 9:20 AM MRN: [id number] Endoscopist: Milus Banister , MD Age: 58 Referring MD:  Date of Birth: November 22, 1960 Gender: Female Account #: [id number] Procedure:                Colonoscopy Indications:              Abdominal pain in the left lower quadrant Medicines:                Monitored Anesthesia Care Procedure:                Pre-Anesthesia Assessment:                           - Prior to the procedure, a History and Physical                            was performed, and patient medications and                            allergies were reviewed. The patient's tolerance of                            previous anesthesia was also reviewed. The risks                            and benefits of the procedure and the sedation                            options and risks were discussed with the patient.                            All questions were answered, and informed consent                            was obtained. Prior Anticoagulants: The patient has                            taken no previous anticoagulant or antiplatelet                            agents. ASA Grade Assessment: II - A patient with                            mild systemic disease. After reviewing the risks                            and benefits, the patient was deemed in                            satisfactory condition to undergo the procedure.                           After obtaining informed consent, the colonoscope  was passed under direct vision. Throughout the                            procedure, the patient's blood pressure, pulse, and                            oxygen saturations were monitored continuously. The                            Colonoscope was introduced through the anus and                            advanced to the the cecum, identified by                            appendiceal orifice and  ileocecal valve. The                            colonoscopy was performed without difficulty. The                            patient tolerated the procedure well. The quality                            of the bowel preparation was good. The ileocecal                            valve, appendiceal orifice, and rectum were                            photographed. Scope In: 9:25:29 AM Scope Out: 9:42:27 AM Scope Withdrawal Time: 0 hours 14 minutes 9 seconds  Total Procedure Duration: 0 hours 16 minutes 58 seconds  Findings:                 The entire examined colon appeared normal on direct                            and retroflexion views. Complications:            No immediate complications. Estimated blood loss:                            None. Estimated Blood Loss:     Estimated blood loss: none. Impression:               - The entire examined colon is normal on direct and                            retroflexion views.                           - No polyps or cancers. Recommendation:           - Patient has a contact number available for  emergencies. The signs and symptoms of potential                            delayed complications were discussed with the                            patient. Return to normal activities tomorrow.                            Written discharge instructions were provided to the                            patient.                           - Resume previous diet.                           - Continue present medications.                           - Repeat colonoscopy in 10 years for screening. Rachael Feeaniel P Vonda Harth, MD 05/23/2019 9:44:01 AM This report has been signed electronically.

## 2019-05-23 NOTE — Patient Instructions (Signed)
YOU HAD AN ENDOSCOPIC PROCEDURE TODAY AT THE [facility]:   Refer to the procedure report that was given to you for any specific questions about what was found during the examination.  If the procedure report does not answer your questions, please call your gastroenterologist to clarify.  If you requested that your care partner not be given the details of your procedure findings, then the procedure report has been included in a sealed envelope for you to review at your convenience later.  YOU SHOULD EXPECT: Some feelings of bloating in the abdomen. Passage of more gas than usual.  Walking can help get rid of the air that was put into your GI tract during the procedure and reduce the bloating. If you had a lower endoscopy (such as a colonoscopy or flexible sigmoidoscopy) you may notice spotting of blood in your stool or on the toilet paper. If you underwent a bowel prep for your procedure, you may not have a normal bowel movement for a few days.  Please Note:  You might notice some irritation and congestion in your nose or some drainage.  This is from the oxygen used during your procedure.  There is no need for concern and it should clear up in a day or so.  SYMPTOMS TO REPORT IMMEDIATELY:   Following lower endoscopy (colonoscopy or flexible sigmoidoscopy):  Excessive amounts of blood in the stool  Significant tenderness or worsening of abdominal pains  Swelling of the abdomen that is new, acute  Fever of 100F or higher  For urgent or emergent issues, a gastroenterologist can be reached at any hour by calling [phone].   DIET:  We do recommend a small meal at first, but then you may proceed to your regular diet.  Drink plenty of fluids but you should avoid alcoholic beverages for 24 hours.  ACTIVITY:  You should plan to take it easy for the rest of today and you should NOT DRIVE or use heavy machinery until tomorrow (because of the sedation medicines used during the test).     FOLLOW UP: Our staff will call the number listed on your records 48-72 hours following your procedure to check on you and address any questions or concerns that you may have regarding the information given to you following your procedure. If we do not reach you, we will leave a message.  We will attempt to reach you two times.  During this call, we will ask if you have developed any symptoms of COVID 19. If you develop any symptoms (ie: fever, flu-like symptoms, shortness of breath, cough etc.) before then, please call [phone].  If you test positive for Covid 19 in the 2 weeks post procedure, please call and report this information to us.    If any biopsies were taken you will be contacted by phone or by letter within the next 1-3 weeks.  Please call us at [phone] if you have not heard about the biopsies in 3 weeks.    SIGNATURES/CONFIDENTIALITY: You and/or your care partner have signed paperwork which will be entered into your electronic medical record.  These signatures attest to the fact that that the information above on your After Visit Summary has been reviewed and is understood.  Full responsibility of the confidentiality of this discharge information lies with you and/or your care-partner. 

## 2019-05-27 ENCOUNTER — Telehealth: Payer: Self-pay

## 2019-05-27 NOTE — Telephone Encounter (Signed)
  Follow up Call-  Call back number 05/23/2019  Post procedure Call Back phone  # [phone]  Permission to leave phone message Yes  Some recent data might be hidden     Patient questions:  Do you have a fever, pain , or abdominal swelling? No. Pain Score  0 *  Have you tolerated food without any problems? Yes.    Have you been able to return to your normal activities? Yes.    Do you have any questions about your discharge instructions: Diet   No. Medications  No. Follow up visit  No.  Do you have questions or concerns about your Care? No.  Actions: * If pain score is 4 or above: No action needed, pain <4.  1. Have you developed a fever since your procedure? no  2.   Have you had an respiratory symptoms (SOB or cough) since your procedure? no  3.   Have you tested positive for COVID 19 since your procedure no  4.   Have you had any family members/close contacts diagnosed with the COVID 19 since your procedure?  no   If yes to any of these questions please route to Joylene John, RN and Alphonsa Gin, Therapist, sports.

## 2019-05-29 DIAGNOSIS — M6281 Muscle weakness (generalized): Secondary | ICD-10-CM | POA: Diagnosis not present

## 2019-05-29 DIAGNOSIS — M62838 Other muscle spasm: Secondary | ICD-10-CM | POA: Diagnosis not present

## 2019-05-29 DIAGNOSIS — M545 Low back pain: Secondary | ICD-10-CM | POA: Diagnosis not present

## 2019-05-29 DIAGNOSIS — K5900 Constipation, unspecified: Secondary | ICD-10-CM | POA: Diagnosis not present

## 2019-06-03 ENCOUNTER — Ambulatory Visit: Payer: BC Managed Care – PPO | Admitting: Gastroenterology

## 2019-06-05 DIAGNOSIS — K5900 Constipation, unspecified: Secondary | ICD-10-CM | POA: Diagnosis not present

## 2019-06-05 DIAGNOSIS — M545 Low back pain: Secondary | ICD-10-CM | POA: Diagnosis not present

## 2019-06-05 DIAGNOSIS — M62838 Other muscle spasm: Secondary | ICD-10-CM | POA: Diagnosis not present

## 2019-06-05 DIAGNOSIS — M6281 Muscle weakness (generalized): Secondary | ICD-10-CM | POA: Diagnosis not present

## 2019-06-10 DIAGNOSIS — Z03818 Encounter for observation for suspected exposure to other biological agents ruled out: Secondary | ICD-10-CM | POA: Diagnosis not present

## 2019-07-14 ENCOUNTER — Telehealth: Payer: Self-pay

## 2019-07-14 NOTE — Telephone Encounter (Signed)
LMOV for pt to call back. Received check from pt in the mail for pt to pay on her account. Need to give further instructions.

## 2019-07-27 ENCOUNTER — Encounter (HOSPITAL_BASED_OUTPATIENT_CLINIC_OR_DEPARTMENT_OTHER): Payer: Self-pay | Admitting: Emergency Medicine

## 2019-07-27 ENCOUNTER — Other Ambulatory Visit: Payer: Self-pay

## 2019-07-27 ENCOUNTER — Emergency Department (HOSPITAL_BASED_OUTPATIENT_CLINIC_OR_DEPARTMENT_OTHER)
Admission: EM | Admit: 2019-07-27 | Discharge: 2019-07-27 | Disposition: A | Discharge status: Home / Self Care | Payer: BC Managed Care – PPO | Attending: Emergency Medicine | Admitting: Emergency Medicine

## 2019-07-27 ENCOUNTER — Emergency Department (HOSPITAL_BASED_OUTPATIENT_CLINIC_OR_DEPARTMENT_OTHER): Payer: BC Managed Care – PPO

## 2019-07-27 DIAGNOSIS — N201 Calculus of ureter: Secondary | ICD-10-CM | POA: Insufficient documentation

## 2019-07-27 DIAGNOSIS — R109 Unspecified abdominal pain: Secondary | ICD-10-CM | POA: Diagnosis not present

## 2019-07-27 DIAGNOSIS — Z79899 Other long term (current) drug therapy: Secondary | ICD-10-CM | POA: Insufficient documentation

## 2019-07-27 DIAGNOSIS — Z880 Allergy status to penicillin: Secondary | ICD-10-CM | POA: Insufficient documentation

## 2019-07-27 DIAGNOSIS — R1031 Right lower quadrant pain: Secondary | ICD-10-CM | POA: Diagnosis not present

## 2019-07-27 LAB — URINALYSIS, MICROSCOPIC (REFLEX)

## 2019-07-27 LAB — URINALYSIS, ROUTINE W REFLEX MICROSCOPIC
Bilirubin Urine: NEGATIVE
Glucose, UA: NEGATIVE mg/dL
Ketones, ur: NEGATIVE mg/dL
Leukocytes,Ua: NEGATIVE
Nitrite: NEGATIVE
Protein, ur: NEGATIVE mg/dL
Specific Gravity, Urine: >1.03 — ABNORMAL HIGH (ref 1.005–1.030)
pH: 6 (ref 5.0–8.0)

## 2019-07-27 MED ORDER — HYDROMORPHONE HCL 1 MG/ML IJ SOLN
1.0000 mg | Freq: Once | INTRAMUSCULAR | Status: AC
  Administered 2019-07-27: 1 mg via INTRAVENOUS
  Filled 2019-07-27: qty 1

## 2019-07-27 MED ORDER — SODIUM CHLORIDE 0.9 % IV BOLUS
1000.0000 mL | Freq: Once | INTRAVENOUS | Status: AC
  Administered 2019-07-27: 1000 mL via INTRAVENOUS

## 2019-07-27 MED ORDER — ONDANSETRON HCL 4 MG/2ML IJ SOLN
INTRAMUSCULAR | Status: AC
  Filled 2019-07-27: qty 2

## 2019-07-27 MED ORDER — ONDANSETRON HCL 4 MG/2ML IJ SOLN
4.0000 mg | Freq: Once | INTRAMUSCULAR | Status: AC | PRN
  Administered 2019-07-27: 05:00:00 4 mg via INTRAVENOUS

## 2019-07-27 MED ORDER — ONDANSETRON HCL 4 MG/2ML IJ SOLN
4.0000 mg | Freq: Once | INTRAMUSCULAR | Status: AC
  Administered 2019-07-27: 4 mg via INTRAVENOUS
  Filled 2019-07-27: qty 2

## 2019-07-27 MED ORDER — TAMSULOSIN HCL 0.4 MG PO CAPS
0.4000 mg | ORAL_CAPSULE | Freq: Once | ORAL | Status: AC
  Administered 2019-07-27: 0.4 mg via ORAL
  Filled 2019-07-27: qty 1

## 2019-07-27 NOTE — ED Provider Notes (Signed)
[facility] DEPT [facility] Provider Note: Georgena Spurling, MD, FACEP  CSN: [id number] MRN: [id number] ARRIVAL: 07/27/19 at [place]: [place]  Flank Pain   HISTORY OF PRESENT ILLNESS  07/27/19 4:59 AM Laurie Rangel is a 58 y.o. female with a remote history of kidney stones.  She is here with right flank pain that began about 2 hours ago.  The pain is rated as a 9 out of 10 and radiates to her right lower quadrant.  It is not changed with movement or palpation.  She has had associated nausea and vomiting.  She has not noted hematuria.   Past Medical History:  Diagnosis Date  . Allergy   . Depression   . Hyperlipidemia   . Kidney stone 2011   lithroscopy 2014    Past Surgical History:  Procedure Laterality Date  . COLONOSCOPY  01/2010   in [place]  . laprascopy  1989   exploratory  . lithroscopy  2014   kidney stone    Family History  Problem Relation Age of Onset  . Hypertension Mother   . Heart disease Mother   . Diabetes Father   . Colon cancer Neg Hx   . Esophageal cancer Neg Hx   . Rectal cancer Neg Hx   . Stomach cancer Neg Hx     Social History   Tobacco Use  . Smoking status: Never Smoker  . Smokeless tobacco: Never Used  Substance Use Topics  . Alcohol use: Yes    Comment: social  . Drug use: Never    Prior to Admission medications   Medication Sig Start Date End Date Taking? Authorizing Provider  Cholecalciferol (VITAMIN D3 SUPER STRENGTH) 50 MCG (2000 UT) CAPS Take by mouth.    [provider]  escitalopram (LEXAPRO) 5 MG tablet Take 5 mg by mouth daily. 04/25/19   [provider]  Multiple Vitamins-Minerals (CENTRUM SILVER ULTRA WOMENS PO) Take by mouth.    [provider]  Multiple Vitamins-Minerals (PRESERVISION AREDS 2+MULTI VIT PO) Take by mouth.    [provider]  omeprazole (PRILOSEC) 20 MG capsule Take 20 mg by mouth daily.    [provider]  simvastatin (ZOCOR) 20 MG  tablet Take 20 mg by mouth daily. 02/19/19   [provider]  zolpidem (AMBIEN) 5 MG tablet Take 1 tablet (5 mg total) by mouth at bedtime as needed for sleep. Patient not taking: Reported on 05/16/2019 02/27/14 07/27/19  Carlyle Basques, MD    Allergies Penicillins   REVIEW OF SYSTEMS  Negative except as noted here or in the History of Present Illness.   PHYSICAL EXAMINATION  Initial Vital Signs Blood pressure (!) 149/96, pulse 74, temperature 98.4 F (36.9 C), temperature source Oral, resp. rate (!) 22, height 5\' 3"  (1.6 m), weight 76.2 kg, SpO2 98 %.  Examination General: Well-developed, well-nourished female in no acute distress; appearance consistent with age of record HENT: normocephalic; atraumatic Eyes: Normal appearance Neck: supple Heart: regular rate and rhythm Lungs: clear to auscultation bilaterally Abdomen: soft; nondistended; nontender; bowel sounds present GU: No CVA tenderness Extremities: No deformity; full range of motion; pulses normal Neurologic: Awake, alert and oriented; motor function intact in all extremities and symmetric; no facial droop Skin: Warm and dry Psychiatric: Grimacing   RESULTS  Summary of this visit's results, reviewed and interpreted by myself:   EKG Interpretation  Date/Time:    Ventricular Rate:    PR Interval:    QRS Duration:  QT Interval:    QTC Calculation:   R Axis:     Text Interpretation:        Laboratory Studies: No results found for this or any previous visit (from the past 24 hour(s)). Imaging Studies: CT Renal Stone Study  Result Date: 07/27/2019 CLINICAL DATA:  Initial evaluation for right flank pain. EXAM: CT ABDOMEN AND PELVIS WITHOUT CONTRAST TECHNIQUE: Multidetector CT imaging of the abdomen and pelvis was performed following the standard protocol without IV contrast. COMPARISON:  Prior CT from 04/23/2019. FINDINGS: Lower chest: Limited noncontrast evaluation of the liver is unremarkable.  Gallbladder within normal limits. No biliary dilatation. Hepatobiliary: Pancreas within normal limits. Pancreas: Pancreas within normal limits. Spleen: Subcentimeter calcified granuloma noted within the spleen. Spleen otherwise unremarkable. Adrenals/Urinary Tract: Adrenal glands are normal. Left kidney unremarkable without nephrolithiasis or hydronephrosis. No radiopaque calculi seen along the course of the left renal collecting system. No left-sided hydroureter. On the right there is an obstructive 3 mm stone position at the right UVJ with secondary mild to moderate right hydroureteronephrosis. Additional 5 mm nonobstructive stone present at the lower pole of the right kidney. No other radiopaque calculi seen along the course of the right renal collecting system. Bladder largely decompressed without acute abnormality. No other layering stones within the bladder lumen. Stomach/Bowel: Stomach within normal limits. No evidence for bowel obstruction. Normal appendix. No acute inflammatory changes seen about the bowels. Vascular/Lymphatic: Intra-abdominal aorta of normal caliber. No adenopathy. Reproductive: Uterus and ovaries within normal limits. Other: No free air or fluid. Musculoskeletal: No acute osseous abnormality. No discrete lytic or blastic osseous lesions. IMPRESSION: 1. 3 mm obstructive stone at the right UVJ with secondary mild to moderate right hydroureteronephrosis. 2. Additional 5 mm nonobstructive right renal calculus. 3. No other acute intra-abdominal or pelvic process. Electronically Signed   By: Rise Mu M.D.   On: 07/27/2019 05:43    ED COURSE and MDM  Nursing notes, initial and subsequent vitals signs, including pulse oximetry, reviewed and interpreted by myself.  Vitals:   07/27/19 0454 07/27/19 0459 07/27/19 0620  BP: (!) 149/96  118/65  Pulse: 74  78  Resp: (!) 22  20  Temp:  98.4 F (36.9 C)   TempSrc:  Oral   SpO2: 98%  98%  Weight: 76.2 kg    Height: 5\' 3"  (1.6  m)     Medications  ondansetron (ZOFRAN) injection 4 mg (4 mg Intravenous Given 07/27/19 0457)  HYDROmorphone (DILAUDID) injection 1 mg (1 mg Intravenous Given 07/27/19 0506)  tamsulosin (FLOMAX) capsule 0.4 mg (0.4 mg Oral Given 07/27/19 0556)  sodium chloride 0.9 % bolus 1,000 mL (1,000 mLs Intravenous New Bag/Given 07/27/19 0555)  HYDROmorphone (DILAUDID) injection 1 mg (1 mg Intravenous Given 07/27/19 0548)  ondansetron (ZOFRAN) injection 4 mg (4 mg Intravenous Given 07/27/19 0548)   6:49 AM Patient pain-free at the present time.  Patient voided and passed stone is present in the specimen.  We will send stone to lab for analysis.  PROCEDURES  Procedures   ED DIAGNOSES     ICD-10-CM   1. Ureterolithiasis  N20.1        Khiree Bukhari, 07/29/19, MD 07/27/19 [phone]

## 2019-07-27 NOTE — ED Notes (Signed)
Pt took 12.5 mg phenergan po before arrival.

## 2019-07-27 NOTE — ED Notes (Signed)
PT drinking po fluids. Denies nausea.

## 2019-07-27 NOTE — ED Notes (Signed)
Pt states unable to give urine sample at this time 

## 2019-07-27 NOTE — ED Notes (Signed)
Patient transported to CT 

## 2019-07-27 NOTE — ED Triage Notes (Signed)
Right flank pain with vomiting onset 2 hours ago.

## 2019-08-05 ENCOUNTER — Encounter: Payer: Self-pay | Admitting: Family Medicine

## 2019-08-06 LAB — CALCULI, WITH PHOTOGRAPH (CLINICAL LAB)
Calcium Oxalate Dihydrate: 50 %
Calcium Oxalate Monohydrate: 50 %
Weight Calculi: 11 mg

## 2019-10-15 ENCOUNTER — Telehealth: Payer: Self-pay | Admitting: Family Medicine

## 2019-10-15 NOTE — Telephone Encounter (Signed)
Patient is calling and requesting a refill for simvastatin, escitalopram and omeprazole sent to [facility] on [address] in [facility]. CB is [phone]

## 2019-10-16 ENCOUNTER — Telehealth (INDEPENDENT_AMBULATORY_CARE_PROVIDER_SITE_OTHER): Payer: BC Managed Care – PPO | Admitting: Family Medicine

## 2019-10-16 ENCOUNTER — Encounter: Payer: Self-pay | Admitting: Family Medicine

## 2019-10-16 DIAGNOSIS — F419 Anxiety disorder, unspecified: Secondary | ICD-10-CM

## 2019-10-16 MED ORDER — OMEPRAZOLE 20 MG PO CPDR: 20.0000 mg | DELAYED_RELEASE_CAPSULE | Freq: Every day | ORAL | 3 refills | Status: DC

## 2019-10-16 MED ORDER — ALPRAZOLAM 0.5 MG PO TABS: ORAL_TABLET | ORAL | 0 refills | Status: DC

## 2019-10-16 MED ORDER — SIMVASTATIN 20 MG PO TABS: 20.0000 mg | ORAL_TABLET | Freq: Every day | ORAL | 3 refills | Status: DC

## 2019-10-16 MED ORDER — ESCITALOPRAM OXALATE 5 MG PO TABS: 5.0000 mg | ORAL_TABLET | Freq: Every day | ORAL | 3 refills | Status: DC

## 2019-10-16 NOTE — Telephone Encounter (Signed)
Last OV 05/02/19 Last fill for Simvastatin 02/19/19 Historical Last fill for Escitalopram 04/25/19 Historical

## 2019-10-16 NOTE — Telephone Encounter (Signed)
Refills sent

## 2019-10-16 NOTE — Patient Instructions (Signed)
Health Maintenance Due  Topic Date Due  . Hepatitis C Screening  Never done  . HIV Screening  Never done  . TETANUS/TDAP  Never done  . PAP SMEAR-Modifier  Never done  . INFLUENZA VACCINE  03/08/2019    No flowsheet data found.

## 2019-10-16 NOTE — Progress Notes (Signed)
Virtual Visit via Video Note  I connected with Laurie Rangel on 10/16/19 at 11:30 AM EST by a video enabled telemedicine application and verified that I am speaking with the correct person using two identifiers. Location patient: home Location provider: work or home office Persons participating in the virtual visit: patient, provider  I discussed the limitations of evaluation and management by telemedicine and the availability of in person appointments. The patient expressed understanding and agreed to proceed.  Chief Complaint  Patient presents with  . Anxiety    Pt would like to discuss some anxiety issues.     HPI: Laurie Rangel is a 59 y.o. female     Past Medical History:  Diagnosis Date  . Allergy   . Depression   . Hyperlipidemia   . Kidney stone 2011   lithroscopy 2014    Past Surgical History:  Procedure Laterality Date  . COLONOSCOPY  01/2010   in [place]  . laprascopy  1989   exploratory  . lithroscopy  2014   kidney stone    Family History  Problem Relation Age of Onset  . Hypertension Mother   . Heart disease Mother   . Diabetes Father   . Colon cancer Neg Hx   . Esophageal cancer Neg Hx   . Rectal cancer Neg Hx   . Stomach cancer Neg Hx     Social History   Tobacco Use  . Smoking status: Never Smoker  . Smokeless tobacco: Never Used  Substance Use Topics  . Alcohol use: Yes    Comment: social  . Drug use: Never     Current Outpatient Medications:  .  Cholecalciferol (VITAMIN D3 SUPER STRENGTH) 50 MCG (2000 UT) CAPS, Take by mouth., Disp: , Rfl:  .  escitalopram (LEXAPRO) 5 MG tablet, Take 1 tablet (5 mg total) by mouth daily., Disp: 90 tablet, Rfl: 3 .  Multiple Vitamins-Minerals (CENTRUM SILVER ULTRA WOMENS PO), Take by mouth., Disp: , Rfl:  .  Multiple Vitamins-Minerals (PRESERVISION AREDS 2+MULTI VIT PO), Take by mouth., Disp: , Rfl:  .  simvastatin (ZOCOR) 20 MG tablet, Take 1 tablet (20 mg total) by mouth daily., Disp: 90 tablet,  Rfl: 3 .  ALPRAZolam (XANAX) 0.5 MG tablet, 1/2 - 1 tab po daily PRN, Disp: 30 tablet, Rfl: 0  Allergies  Allergen Reactions  . Penicillins Hives      ROS: See pertinent positives and negatives per HPI.   EXAM:  VITALS per patient if applicable:  GENERAL: alert, oriented, appears well and in no acute distress  HEENT: atraumatic, conjunctiva clear, no obvious abnormalities on inspection of external nose and ears  NECK: normal movements of the head and neck  LUNGS: on inspection no signs of respiratory distress, breathing rate appears normal, no obvious gross SOB, gasping or wheezing, no conversational dyspnea  CV: no obvious cyanosis  MS: moves all visible extremities without noticeable abnormality  PSYCH/NEURO: pleasant and cooperative, no obvious depression or anxiety, speech and thought processing grossly intact   ASSESSMENT AND PLAN:  1. Anxiety - pt on lexapro 5mg  daily - pt Rx'd xanax 0.5mg  tabs 1/2-1 tab daily PRN and last filled in 2018, 3 tabs left in bottle of this Rx and refill expired in 08/2017 - pt takes very infrequently but requests refill to have when needed - database reviewed no Rx found on file Refill - alprazolam 0.5mg  1/2 - 1 tab po daily PRN #30 0RF  Pt will schedule CPE for late summer, early fall  2021    I discussed the assessment and treatment plan with the patient. The patient was provided an opportunity to ask questions and all were answered. The patient agreed with the plan and demonstrated an understanding of the instructions.   The patient was advised to call back or seek an in-person evaluation if the symptoms worsen or if the condition fails to improve as anticipated.   Letta Median, DO

## 2020-05-11 DIAGNOSIS — R062 Wheezing: Secondary | ICD-10-CM | POA: Diagnosis not present

## 2020-05-11 DIAGNOSIS — R0602 Shortness of breath: Secondary | ICD-10-CM | POA: Diagnosis not present

## 2020-05-11 DIAGNOSIS — J069 Acute upper respiratory infection, unspecified: Secondary | ICD-10-CM | POA: Diagnosis not present

## 2020-06-07 ENCOUNTER — Encounter: Payer: Self-pay | Admitting: Family Medicine

## 2020-06-07 DIAGNOSIS — F4321 Adjustment disorder with depressed mood: Secondary | ICD-10-CM

## 2020-06-10 DIAGNOSIS — H903 Sensorineural hearing loss, bilateral: Secondary | ICD-10-CM | POA: Diagnosis not present

## 2020-06-10 DIAGNOSIS — H9313 Tinnitus, bilateral: Secondary | ICD-10-CM | POA: Diagnosis not present

## 2020-06-21 DIAGNOSIS — D1801 Hemangioma of skin and subcutaneous tissue: Secondary | ICD-10-CM | POA: Diagnosis not present

## 2020-06-21 DIAGNOSIS — D2272 Melanocytic nevi of left lower limb, including hip: Secondary | ICD-10-CM | POA: Diagnosis not present

## 2020-06-21 DIAGNOSIS — L821 Other seborrheic keratosis: Secondary | ICD-10-CM | POA: Diagnosis not present

## 2020-06-21 DIAGNOSIS — D225 Melanocytic nevi of trunk: Secondary | ICD-10-CM | POA: Diagnosis not present

## 2020-06-22 DIAGNOSIS — Z1231 Encounter for screening mammogram for malignant neoplasm of breast: Secondary | ICD-10-CM | POA: Diagnosis not present

## 2020-06-22 LAB — HM MAMMOGRAPHY

## 2020-06-24 ENCOUNTER — Encounter: Payer: Self-pay | Admitting: Family Medicine

## 2020-07-05 DIAGNOSIS — R300 Dysuria: Secondary | ICD-10-CM | POA: Diagnosis not present

## 2020-07-06 ENCOUNTER — Other Ambulatory Visit: Payer: Self-pay

## 2020-07-07 ENCOUNTER — Ambulatory Visit (INDEPENDENT_AMBULATORY_CARE_PROVIDER_SITE_OTHER): Payer: BC Managed Care – PPO | Admitting: Family Medicine

## 2020-07-07 ENCOUNTER — Encounter: Payer: Self-pay | Admitting: Family Medicine

## 2020-07-07 VITALS — BP 112/74 | HR 87 | Temp 97.5°F | Ht 63.0 in | Wt 167.2 lb

## 2020-07-07 DIAGNOSIS — F419 Anxiety disorder, unspecified: Secondary | ICD-10-CM | POA: Diagnosis not present

## 2020-07-07 DIAGNOSIS — Z0000 Encounter for general adult medical examination without abnormal findings: Secondary | ICD-10-CM | POA: Diagnosis not present

## 2020-07-07 DIAGNOSIS — Z1321 Encounter for screening for nutritional disorder: Secondary | ICD-10-CM | POA: Diagnosis not present

## 2020-07-07 DIAGNOSIS — Z23 Encounter for immunization: Secondary | ICD-10-CM | POA: Diagnosis not present

## 2020-07-07 DIAGNOSIS — Z1159 Encounter for screening for other viral diseases: Secondary | ICD-10-CM | POA: Diagnosis not present

## 2020-07-07 DIAGNOSIS — E785 Hyperlipidemia, unspecified: Secondary | ICD-10-CM

## 2020-07-07 MED ORDER — ESCITALOPRAM OXALATE 5 MG PO TABS: 5.0000 mg | ORAL_TABLET | Freq: Every day | ORAL | 3 refills | Status: DC

## 2020-07-07 MED ORDER — ALPRAZOLAM 0.5 MG PO TABS: ORAL_TABLET | ORAL | 2 refills | Status: DC

## 2020-07-07 MED ORDER — SIMVASTATIN 20 MG PO TABS: 20.0000 mg | ORAL_TABLET | Freq: Every day | ORAL | 3 refills | Status: DC

## 2020-07-07 NOTE — Progress Notes (Signed)
Laurie Rangel is a 59 y.o. female  Chief Complaint  Patient presents with  . Annual Exam    CPE, patinet not sure if she needs Hep C screen, last meal at 7am.     HPI: Laurie Rangel is a 59 y.o. female seen today for annual CPE. She last ate at 7am (7.5hrs ago). She is involved in grief counseling after her sister committed suicide in 05/2020.  Last PAP: due - follows with GYN at [facility] Last mammo: 06/2020 Last colonoscopy: 05/2019 - Dr. Christella Hartigan w/ [facility] - due in 2030.  Dental: due Vision: UTD  Past Medical History:  Diagnosis Date  . Allergy   . Depression   . Hyperlipidemia   . Kidney stone 2011   lithroscopy 2014    Past Surgical History:  Procedure Laterality Date  . COLONOSCOPY  01/2010   in [place]  . laprascopy  1989   exploratory  . lithroscopy  2014   kidney stone    Social History   Socioeconomic History  . Marital status: Married    Spouse name: Not on file  . Number of children: Not on file  . Years of education: Not on file  . Highest education level: Not on file  Occupational History  . Not on file  Tobacco Use  . Smoking status: Never Smoker  . Smokeless tobacco: Never Used  Vaping Use  . Vaping Use: Never used  Substance and Sexual Activity  . Alcohol use: Yes    Comment: social  . Drug use: Never  . Sexual activity: Not on file  Other Topics Concern  . Not on file  Social History Narrative  . Not on file   Social Determinants of Health   Financial Resource Strain:   . Difficulty of Paying Living Expenses: Not on file  Food Insecurity:   . Worried About Programme researcher, broadcasting/film/video in the Last Year: Not on file  . Ran Out of Food in the Last Year: Not on file  Transportation Needs:   . Lack of Transportation (Medical): Not on file  . Lack of Transportation (Non-Medical): Not on file  Physical Activity:   . Days of Exercise per Week: Not on file  . Minutes of Exercise per Session: Not on file  Stress:   . Feeling of Stress :  Not on file  Social Connections:   . Frequency of Communication with Friends and Family: Not on file  . Frequency of Social Gatherings with Friends and Family: Not on file  . Attends Religious Services: Not on file  . Active Member of Clubs or Organizations: Not on file  . Attends Banker Meetings: Not on file  . Marital Status: Not on file  Intimate Partner Violence:   . Fear of Current or Ex-Partner: Not on file  . Emotionally Abused: Not on file  . Physically Abused: Not on file  . Sexually Abused: Not on file    Family History  Problem Relation Age of Onset  . Hypertension Mother   . Heart disease Mother   . Diabetes Father   . Colon cancer Neg Hx   . Esophageal cancer Neg Hx   . Rectal cancer Neg Hx   . Stomach cancer Neg Hx      Immunization History  Administered Date(s) Administered  . DTP 08/17/2010  . Hep A / Hep B 10/15/2001, 12/02/2001, 04/21/2002  . Hepatitis B 03/29/2006  . Influenza,inj,Quad PF,6+ Mos 05/16/2012  . Influenza-Unspecified 06/17/2020  .  PFIZER SARS-COV-2 Vaccination 04/17/2019, 05/08/2019, 11/10/2019  . Tdap 06/08/2011  . Typhoid Live 12/13/2012  . Typhoid Parenteral 08/17/2010  . Yellow Fever 12/13/2012  . Zoster Recombinat (Shingrix) 06/17/2020    Outpatient Encounter Medications as of 07/07/2020  Medication Sig  . ALPRAZolam (XANAX) 0.5 MG tablet 1/2 - 1 tab po daily PRN  . Cholecalciferol (VITAMIN D3 SUPER STRENGTH) 50 MCG (2000 UT) CAPS Take by mouth.  . escitalopram (LEXAPRO) 5 MG tablet Take 1 tablet (5 mg total) by mouth daily.  . Multiple Vitamins-Minerals (CENTRUM SILVER ULTRA WOMENS PO) Take by mouth.  . simvastatin (ZOCOR) 20 MG tablet Take 1 tablet (20 mg total) by mouth daily.  . Multiple Vitamins-Minerals (PRESERVISION AREDS 2+MULTI VIT PO) Take by mouth. (Patient not taking: Reported on 07/07/2020)  . [DISCONTINUED] zolpidem (AMBIEN) 5 MG tablet Take 1 tablet (5 mg total) by mouth at bedtime as needed for sleep.  (Patient not taking: Reported on 05/16/2019)   No facility-administered encounter medications on file as of 07/07/2020.     ROS: Gen: no fever, chills  Skin: no rash, itching ENT: no ear pain, ear drainage, nasal congestion, rhinorrhea, sinus pressure, sore throat Eyes: no blurry vision, double vision Resp: no cough, wheeze,SOB CV: no CP, palpitations, LE edema,  GI: no heartburn, n/v/d/c, abd pain GU: no dysuria, urgency, frequency, hematuria MSK: no joint pain, myalgias, back pain Neuro: no dizziness, headache, weakness, vertigo Psych: no depression, anxiety, insomnia   Allergies  Allergen Reactions  . Penicillins Hives    BP 112/74   Pulse 87   Temp (!) 97.5 F (36.4 C) (Tympanic)   Ht 5\' 3"  (1.6 m)   Wt 167 lb 3.2 oz (75.8 kg)   SpO2 98%   BMI 29.62 kg/m   Physical Exam Constitutional:      General: She is not in acute distress.    Appearance: She is well-developed.  HENT:     Head: Normocephalic and atraumatic.     Right Ear: Tympanic membrane and ear canal normal.     Left Ear: Tympanic membrane and ear canal normal.     Nose: Nose normal.  Eyes:     Conjunctiva/sclera: Conjunctivae normal.     Pupils: Pupils are equal, round, and reactive to light.  Neck:     Thyroid: No thyromegaly.  Cardiovascular:     Rate and Rhythm: Normal rate and regular rhythm.     Heart sounds: Normal heart sounds. No murmur heard.   Pulmonary:     Effort: Pulmonary effort is normal. No respiratory distress.     Breath sounds: Normal breath sounds. No wheezing or rhonchi.  Abdominal:     General: Bowel sounds are normal. There is no distension.     Palpations: Abdomen is soft. There is no mass.     Tenderness: There is no abdominal tenderness.  Musculoskeletal:     Cervical back: Neck supple.     Right lower leg: No edema.     Left lower leg: No edema.  Lymphadenopathy:     Cervical: No cervical adenopathy.  Skin:    General: Skin is warm and dry.  Neurological:      Mental Status: She is alert and oriented to person, place, and time.     Motor: No abnormal muscle tone.     Coordination: Coordination normal.  Psychiatric:        Behavior: Behavior normal.      A/P:  1. Annual physical exam - discussed importance of regular  CV exercise, healthy diet, adequate sleep - UTD on vision exam, due for dental exam - UTD on mammo, will schedule PAP with GYN, colonoscopy UTD -  Immunizations UTD - ALT - AST - Basic metabolic panel - Lipid panel - CBC - Urinalysis, Routine w reflex microscopic - next CPE in 1 year  2. Anxiety - stable, controlled - database reviewed and appropriate Refill: - escitalopram (LEXAPRO) 5 MG tablet; Take 1 tablet (5 mg total) by mouth daily.  Dispense: 90 tablet; Refill: 3 - ALPRAZolam (XANAX) 0.5 MG tablet; 1/2 - 1 tab po daily PRN  Dispense: 30 tablet; Refill: 2  3. Hyperlipidemia, unspecified hyperlipidemia type - ALT - AST - Lipid panel Refill: - simvastatin (ZOCOR) 20 MG tablet; Take 1 tablet (20 mg total) by mouth daily.  Dispense: 90 tablet; Refill: 3  4. Encounter for vitamin deficiency screening - VITAMIN D 25 Hydroxy (Vit-D Deficiency, Fractures)  5. Need for hepatitis C screening test - Hepatitis C Antibody  6. Need for meningitis vaccination - last had in 07/2015 and pt is traveling internationally in the coming months and would like to get  - Meningococcal conjugate vaccine (Menactra)   This visit occurred during the SARS-CoV-2 public health emergency.  Safety protocols were in place, including screening questions prior to the visit, additional usage of staff PPE, and extensive cleaning of exam room while observing appropriate contact time as indicated for disinfecting solutions.

## 2020-07-08 ENCOUNTER — Encounter: Payer: Self-pay | Admitting: Family Medicine

## 2020-07-08 DIAGNOSIS — Z0000 Encounter for general adult medical examination without abnormal findings: Secondary | ICD-10-CM | POA: Diagnosis not present

## 2020-07-08 LAB — BASIC METABOLIC PANEL
BUN: 20 mg/dL (ref 6–23)
CO2: 27 mEq/L (ref 19–32)
Calcium: 9.5 mg/dL (ref 8.4–10.5)
Chloride: 106 mEq/L (ref 96–112)
Creatinine, Ser: 0.78 mg/dL (ref 0.40–1.20)
GFR: 82.36 mL/min (ref >60.00)
Glucose, Bld: 94 mg/dL (ref 70–99)
Potassium: 4 mEq/L (ref 3.5–5.1)
Sodium: 141 mEq/L (ref 135–145)

## 2020-07-08 LAB — AST: AST: 26 U/L (ref 0–37)

## 2020-07-08 LAB — CBC
HCT: 41.4 % (ref 36.0–46.0)
Hemoglobin: 13.9 g/dL (ref 12.0–15.0)
MCHC: 33.6 g/dL (ref 30.0–36.0)
MCV: 94.3 fl (ref 78.0–100.0)
Platelets: 271 10*3/uL (ref 150.0–400.0)
RBC: 4.39 Mil/uL (ref 3.87–5.11)
RDW: 13.2 % (ref 11.5–15.5)
WBC: 6.2 10*3/uL (ref 4.0–10.5)

## 2020-07-08 LAB — LIPID PANEL
Cholesterol: 200 mg/dL (ref 0–200)
HDL: 49.1 mg/dL (ref >39.00)
LDL Cholesterol: 128 mg/dL — ABNORMAL HIGH (ref 0–99)
NonHDL: 151.06
Total CHOL/HDL Ratio: 4
Triglycerides: 117 mg/dL (ref 0.0–149.0)
VLDL: 23.4 mg/dL (ref 0.0–40.0)

## 2020-07-08 LAB — VITAMIN D 25 HYDROXY (VIT D DEFICIENCY, FRACTURES): VITD: 42.37 ng/mL (ref 30.00–100.00)

## 2020-07-08 LAB — HEPATITIS C ANTIBODY
Hepatitis C Ab: NONREACTIVE
SIGNAL TO CUT-OFF: 0.01 (ref <1.00)

## 2020-07-08 LAB — ALT: ALT: 26 U/L (ref 0–35)

## 2020-07-08 NOTE — Addendum Note (Signed)
Addended by: Varney Biles on: 07/08/2020 03:28 PM   Modules accepted: Orders

## 2020-07-08 NOTE — Addendum Note (Signed)
Addended by: Varney Biles on: 07/08/2020 03:27 PM   Modules accepted: Orders

## 2020-07-08 NOTE — Addendum Note (Signed)
Addended by: Varney Biles on: 07/08/2020 03:39 PM   Modules accepted: Orders

## 2020-07-09 ENCOUNTER — Encounter: Payer: Self-pay | Admitting: Family Medicine

## 2020-07-12 LAB — URINALYSIS, ROUTINE W REFLEX MICROSCOPIC
Bacteria, UA: NONE SEEN /HPF
Bilirubin Urine: NEGATIVE
Glucose, UA: NEGATIVE
Hgb urine dipstick: NEGATIVE
Hyaline Cast: NONE SEEN /LPF
Nitrite: NEGATIVE
Protein, ur: NEGATIVE
RBC / HPF: NONE SEEN /HPF (ref 0–2)
Specific Gravity, Urine: 1.007 (ref 1.001–1.03)
pH: 6 (ref 5.0–8.0)

## 2020-07-14 ENCOUNTER — Encounter: Payer: Self-pay | Admitting: Family Medicine

## 2020-07-16 DIAGNOSIS — H903 Sensorineural hearing loss, bilateral: Secondary | ICD-10-CM | POA: Diagnosis not present

## 2020-08-05 DIAGNOSIS — Z6829 Body mass index (BMI) 29.0-29.9, adult: Secondary | ICD-10-CM | POA: Diagnosis not present

## 2020-08-05 DIAGNOSIS — Z01419 Encounter for gynecological examination (general) (routine) without abnormal findings: Secondary | ICD-10-CM | POA: Diagnosis not present

## 2020-08-21 ENCOUNTER — Other Ambulatory Visit: Payer: Self-pay | Admitting: Obstetrics & Gynecology

## 2020-08-21 DIAGNOSIS — Z13820 Encounter for screening for osteoporosis: Secondary | ICD-10-CM

## 2020-09-16 ENCOUNTER — Encounter: Payer: Self-pay | Admitting: Family Medicine

## 2020-09-25 IMAGING — CT CT RENAL STONE PROTOCOL
2 of 4 series · 16 of 46 positions shown, 18 images · non-contrast
Comparison: Prior CT from 04/23/2019.

CLINICAL DATA: Initial evaluation for right flank pain.

EXAM:
CT ABDOMEN AND PELVIS WITHOUT CONTRAST
TECHNIQUE: Multidetector CT imaging of the abdomen and pelvis was performed
following the standard protocol without IV contrast.

[Series 2: axial st · axial · 0.72mm/px · z∈[-542,-138]mm · 13 of 89 slices shown, 15 images]
[im 4/89  soft-tissue]
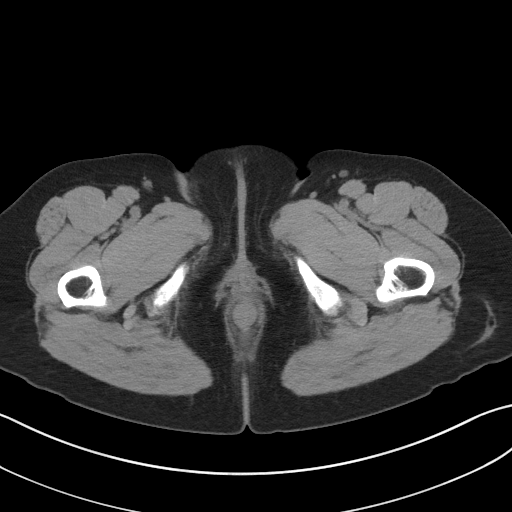
[im 4/89  bone]
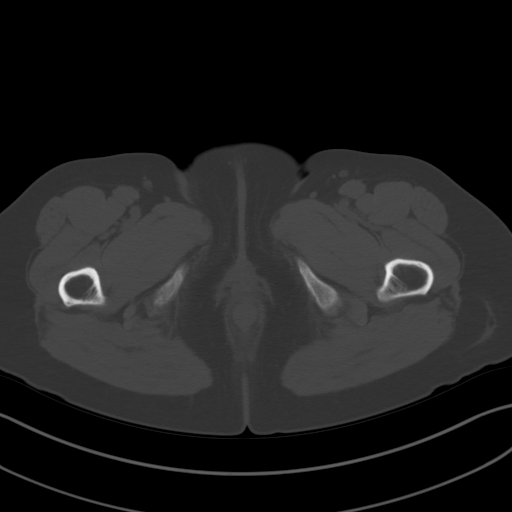
[im 11/89  soft-tissue]
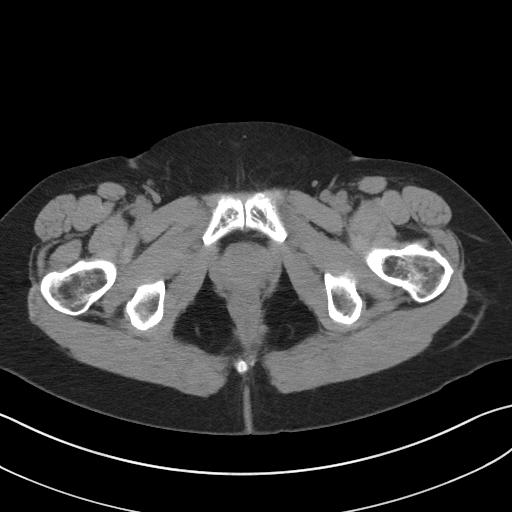
[im 18/89  soft-tissue]
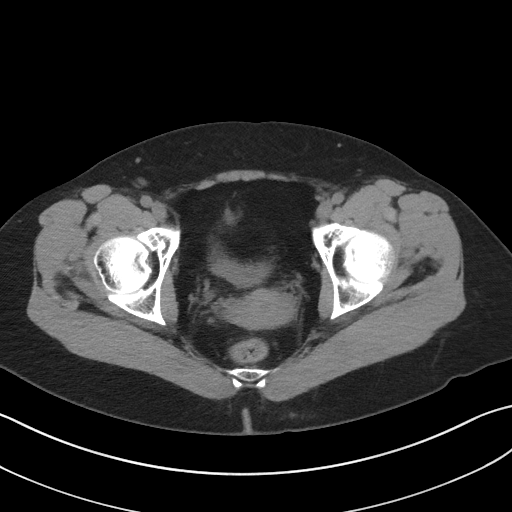
[im 25/89  soft-tissue]
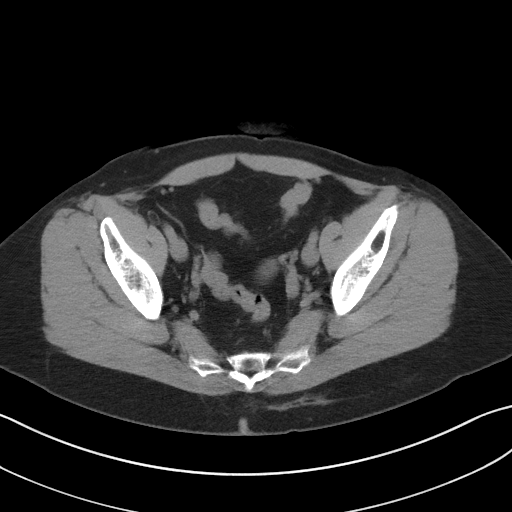
[im 32/89  soft-tissue]
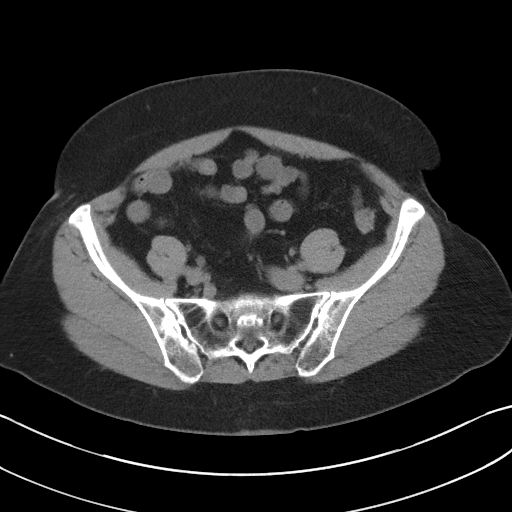
[im 39/89  soft-tissue]
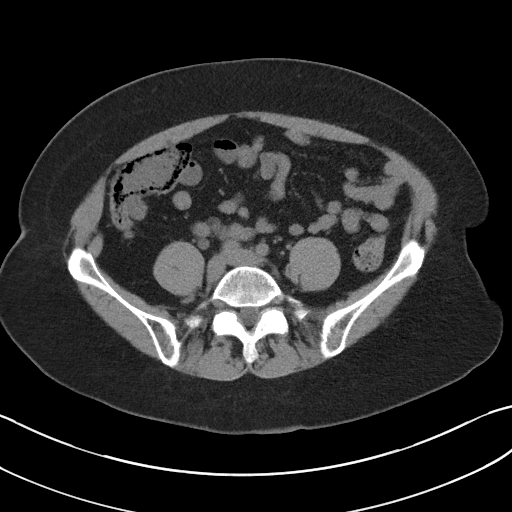
[im 46/89  soft-tissue]
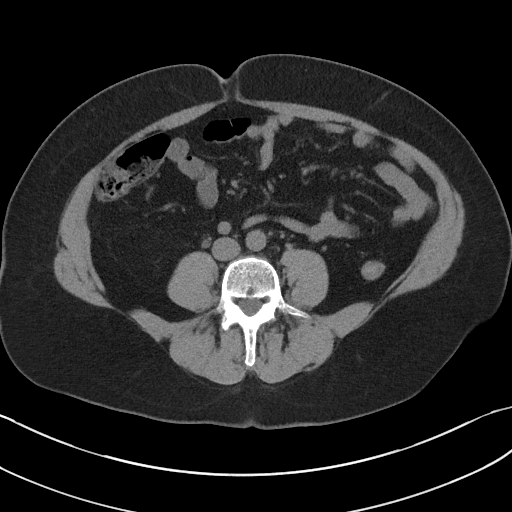
[im 50/89  soft-tissue]
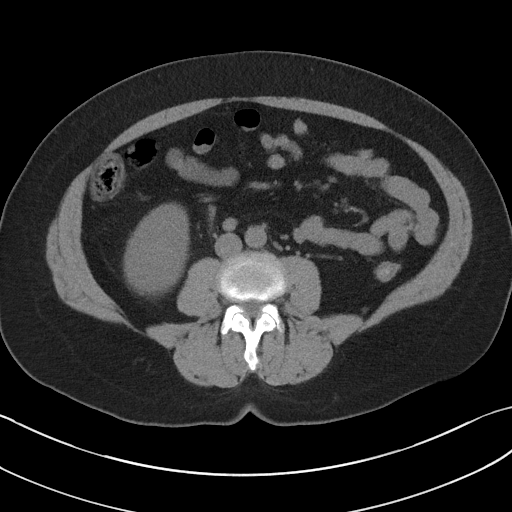
[im 57/89  soft-tissue]
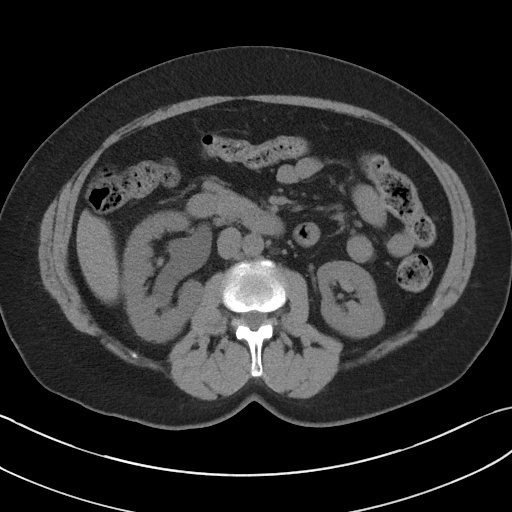
[im 57/89  bone]
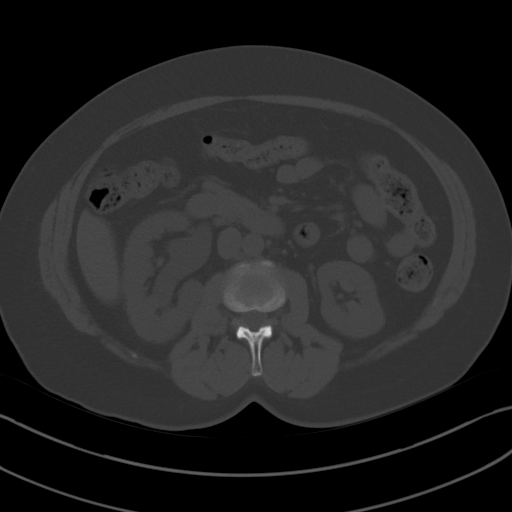
[im 64/89  soft-tissue]
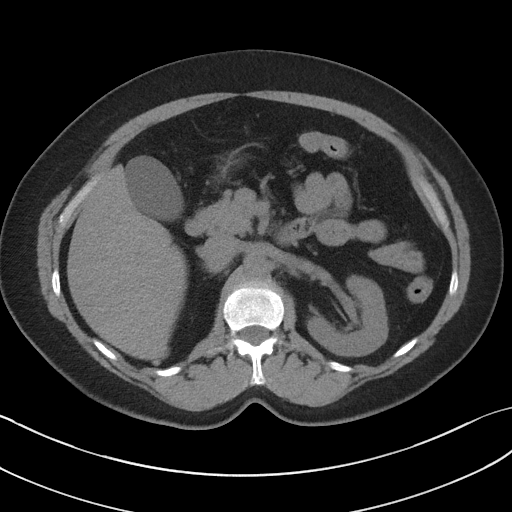
[im 71/89  soft-tissue]
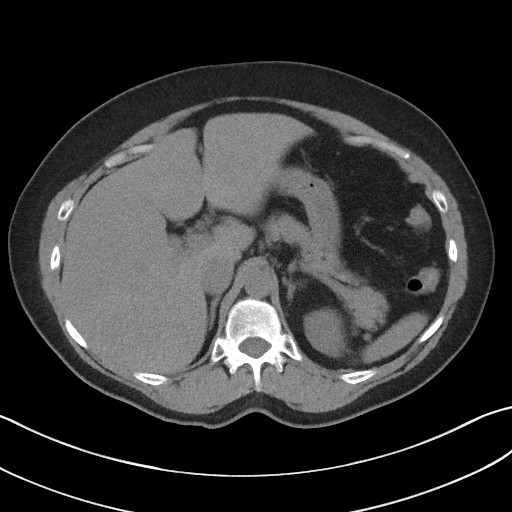
[im 78/89  soft-tissue]
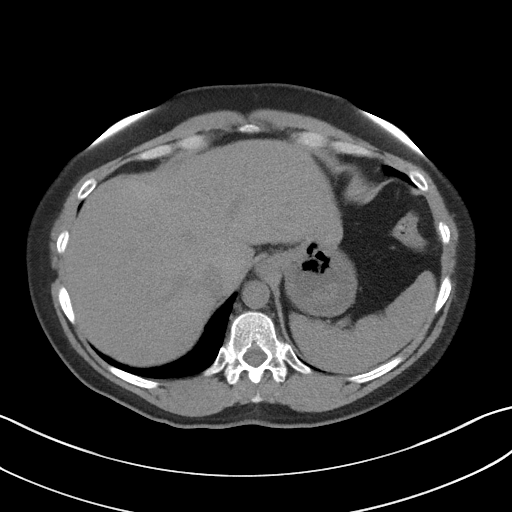
[im 85/89  soft-tissue]
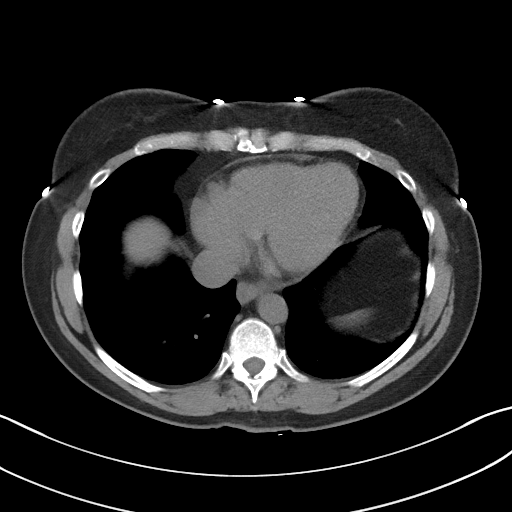

[Series 4: coronal st · coronal · 0.86mm/px · 3 of 85 slices shown]
[im 29/85  soft-tissue]
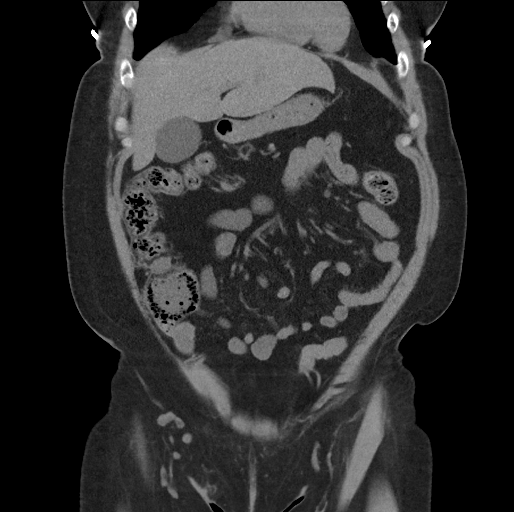
[im 38/85  soft-tissue]
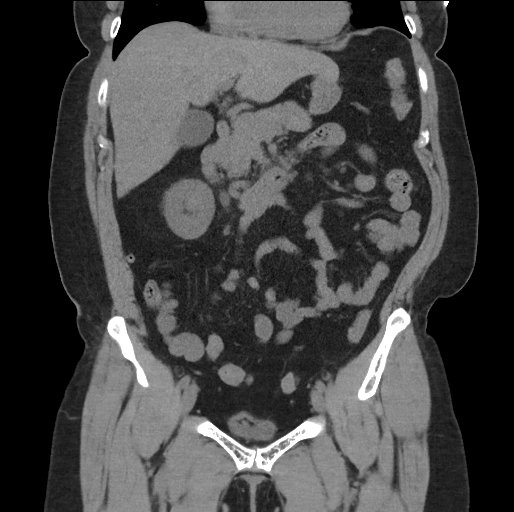
[im 47/85  soft-tissue]
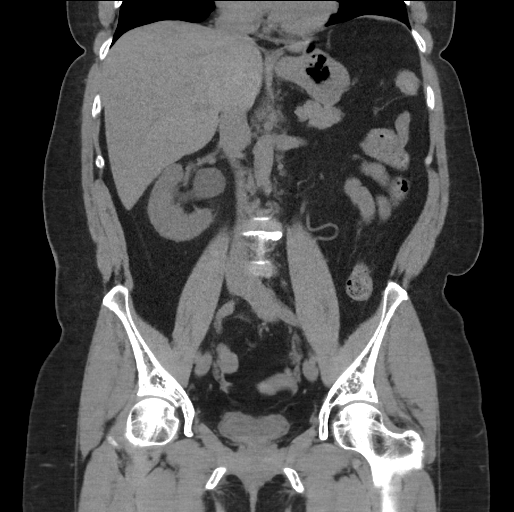

[16 of 46 positions shown; findings below may reference images not displayed]

FINDINGS: Lower chest: Limited noncontrast evaluation of the liver is
unremarkable. Gallbladder within normal limits. No biliary
dilatation.

Hepatobiliary: Pancreas within normal limits.

Pancreas: Pancreas within normal limits.

Spleen: Subcentimeter calcified granuloma noted within the spleen.
Spleen otherwise unremarkable.

Adrenals/Urinary Tract: Adrenal glands are normal.

Left kidney unremarkable without nephrolithiasis or hydronephrosis.
No radiopaque calculi seen along the course of the left renal
collecting system. No left-sided hydroureter.

On the right there is an obstructive 3 mm stone position at the
right UVJ with secondary mild to moderate right
hydroureteronephrosis. Additional 5 mm nonobstructive stone present
at the lower pole of the right kidney. No other radiopaque calculi
seen along the course of the right renal collecting system.

Bladder largely decompressed without acute abnormality. No other
layering stones within the bladder lumen.

Stomach/Bowel: Stomach within normal limits. No evidence for bowel
obstruction. Normal appendix. No acute inflammatory changes seen
about the bowels.

Vascular/Lymphatic: Intra-abdominal aorta of normal caliber. No
adenopathy.

Reproductive: Uterus and ovaries within normal limits.

Other: No free air or fluid.

Musculoskeletal: No acute osseous abnormality. No discrete lytic or
blastic osseous lesions.
IMPRESSION: 1. 3 mm obstructive stone at the right UVJ with secondary mild to
moderate right hydroureteronephrosis.
2. Additional 5 mm nonobstructive right renal calculus.
3. No other acute intra-abdominal or pelvic process.

## 2020-09-27 DIAGNOSIS — D1801 Hemangioma of skin and subcutaneous tissue: Secondary | ICD-10-CM | POA: Diagnosis not present

## 2020-10-20 DIAGNOSIS — Z20822 Contact with and (suspected) exposure to covid-19: Secondary | ICD-10-CM | POA: Diagnosis not present

## 2020-12-29 DIAGNOSIS — F339 Major depressive disorder, recurrent, unspecified: Secondary | ICD-10-CM | POA: Diagnosis not present

## 2020-12-29 DIAGNOSIS — F331 Major depressive disorder, recurrent, moderate: Secondary | ICD-10-CM | POA: Diagnosis not present

## 2020-12-29 DIAGNOSIS — F411 Generalized anxiety disorder: Secondary | ICD-10-CM | POA: Diagnosis not present

## 2021-01-13 ENCOUNTER — Encounter: Payer: Self-pay | Admitting: Family Medicine

## 2021-01-24 DIAGNOSIS — B351 Tinea unguium: Secondary | ICD-10-CM | POA: Diagnosis not present

## 2021-02-03 ENCOUNTER — Telehealth: Payer: Self-pay | Admitting: Family Medicine

## 2021-02-03 NOTE — Telephone Encounter (Signed)
Pt is not at high risk of complication from or hospitalization for covid and does not have any chronic medical issues or immunosuppressive conditions that make her a candidate for antiviral medication. I would treat symptoms supportively, quarantine, and f/u if symptoms progress/worsen

## 2021-02-03 NOTE — Telephone Encounter (Signed)
Patient is out of town, but tested positive for COVID. She has a cough, runny nose, and chest tightness due to cough. She wants to know if an antiviral medication can be sent in for her. Please call her back at [phone] and advise.

## 2021-02-09 NOTE — Telephone Encounter (Signed)
Left a detailed message on voicemail, to call office to discuss Dr. Renaye Rakers response.

## 2021-02-10 NOTE — Telephone Encounter (Signed)
Patient informed. 

## 2021-03-03 ENCOUNTER — Other Ambulatory Visit: Payer: Self-pay

## 2021-03-03 ENCOUNTER — Ambulatory Visit (INDEPENDENT_AMBULATORY_CARE_PROVIDER_SITE_OTHER): Payer: BC Managed Care – PPO | Admitting: Family Medicine

## 2021-03-03 ENCOUNTER — Encounter: Payer: Self-pay | Admitting: Family Medicine

## 2021-03-03 VITALS — BP 128/90 | HR 65 | Temp 97.3°F | Wt 168.0 lb

## 2021-03-03 DIAGNOSIS — F419 Anxiety disorder, unspecified: Secondary | ICD-10-CM

## 2021-03-03 DIAGNOSIS — L519 Erythema multiforme, unspecified: Secondary | ICD-10-CM | POA: Diagnosis not present

## 2021-03-03 DIAGNOSIS — E785 Hyperlipidemia, unspecified: Secondary | ICD-10-CM

## 2021-03-03 MED ORDER — ESCITALOPRAM OXALATE 10 MG PO TABS: 10.0000 mg | ORAL_TABLET | Freq: Every day | ORAL | 3 refills | Status: DC

## 2021-03-03 MED ORDER — SIMVASTATIN 20 MG PO TABS: 20.0000 mg | ORAL_TABLET | Freq: Every day | ORAL | 3 refills | Status: DC

## 2021-03-03 NOTE — Patient Instructions (Signed)
Laurie Penna, NP Dr. Ronnald Nian

## 2021-03-03 NOTE — Progress Notes (Signed)
Laurie Rangel is a 60 y.o. female  Chief Complaint  Patient presents with   Follow-up    Pt c/o skin rash over entire body, itching and stinging sensation x2 weeks. Pt was prescribed methylprednisolone for relief.     HPI: Laurie Rangel is a 60 y.o. female patient   Pt was in [place] trial and got vaccine x 2, got low dose booster, another booster, and then 3rd booster.  Last booster 01/13/21.   Pt tested positive for covid on 7/1 with mild symptoms. She tested herself daily and tested positive thru 7/5 and then negative on 7/6.  Started terbinafine 02/11/21 as Rx'd by podiatry. Rash started while on a 2 wk [place] cruise on 02/18/21. Pt saw doctor on ship. Dx with EM, and was Rx'd prednisone BID x 5 days and then pt went back and was given medrol dose pack and pt is still taking that. She was also given topical steroid cream and pt had taken PRN benadryl. Pt states rash is improving, swelling/raised areas are now flat, skin in those areas are peeling. Still some itch.  No fever.  She stopped terbinafine on 02/22/21.  She needs refills of simvastatin 20mg  daily and lexapro 10mg  daily.   Past Medical History:  Diagnosis Date   Allergy    Depression    Hyperlipidemia    Kidney stone 2011   lithroscopy 2014    Past Surgical History:  Procedure Laterality Date   COLONOSCOPY  01/2010   in [place]   laprascopy  1989   exploratory   lithroscopy  2014   kidney stone    Social History   Socioeconomic History   Marital status: Married    Spouse name: Not on file   Number of children: Not on file   Years of education: Not on file   Highest education level: Not on file  Occupational History   Not on file  Tobacco Use   Smoking status: Never   Smokeless tobacco: Never  Vaping Use   Vaping Use: Never used  Substance and Sexual Activity   Alcohol use: Yes    Comment: social   Drug use: Never   Sexual activity: Not on file  Other Topics Concern   Not on file  Social History  Narrative   Not on file   Social Determinants of Health   Financial Resource Strain: Not on file  Food Insecurity: Not on file  Transportation Needs: Not on file  Physical Activity: Not on file  Stress: Not on file  Social Connections: Not on file  Intimate Partner Violence: Not on file    Family History  Problem Relation Age of Onset   Hypertension Mother    Heart disease Mother    Diabetes Father    Colon cancer Neg Hx    Esophageal cancer Neg Hx    Rectal cancer Neg Hx    Stomach cancer Neg Hx      Immunization History  Administered Date(s) Administered   DTP 08/17/2010   Hep A / Hep B 10/15/2001, 12/02/2001, 04/21/2002   Hepatitis B 03/29/2006   Influenza,inj,Quad PF,6+ Mos 05/16/2012   Influenza-Unspecified 06/17/2020   Meningococcal Mcv4o 07/07/2020   PFIZER(Purple Top)SARS-COV-2 Vaccination 04/17/2019, 05/08/2019, 11/10/2019, 07/14/2020, 01/13/2021   Tdap 06/08/2011   Typhoid Live 12/13/2012   Typhoid Parenteral 08/17/2010   Yellow Fever 12/13/2012   Zoster Recombinat (Shingrix) 06/17/2020, 09/17/2020    Outpatient Encounter Medications as of 03/03/2021  Medication Sig   ALPRAZolam (XANAX) 0.5  MG tablet 1/2 - 1 tab po daily PRN   Cholecalciferol (VITAMIN D3 SUPER STRENGTH) 50 MCG (2000 UT) CAPS Take by mouth.   methylPREDNISolone (MEDROL) 4 MG tablet Take 4 mg by mouth daily.   Multiple Vitamins-Minerals (CENTRUM SILVER ULTRA WOMENS PO) Take by mouth.   simvastatin (ZOCOR) 20 MG tablet Take 1 tablet (20 mg total) by mouth daily.   escitalopram (LEXAPRO) 10 MG tablet Take 10 mg by mouth daily.   [DISCONTINUED] escitalopram (LEXAPRO) 5 MG tablet Take 1 tablet (5 mg total) by mouth daily.   [DISCONTINUED] Multiple Vitamins-Minerals (PRESERVISION AREDS 2+MULTI VIT PO) Take by mouth. (Patient not taking: Reported on 07/07/2020)   [DISCONTINUED] zolpidem (AMBIEN) 5 MG tablet Take 1 tablet (5 mg total) by mouth at bedtime as needed for sleep. (Patient not taking:  Reported on 05/16/2019)   No facility-administered encounter medications on file as of 03/03/2021.     ROS: Pertinent positives and negatives noted in HPI. Remainder of ROS non-contributory   Allergies  Allergen Reactions   Penicillins Hives    BP 128/90 (BP Location: Left Arm, Patient Position: Sitting, Cuff Size: Normal)   Pulse 65   Temp (!) 97.3 F (36.3 C) (Temporal)   Wt 168 lb (76.2 kg)   SpO2 97%   BMI 29.76 kg/m   Physical Exam Constitutional:      General: She is not in acute distress.    Appearance: She is not ill-appearing.  Skin:    Findings: Rash (diffuse erythematous patches and some with overlying peeling skin) present.  Neurological:     Mental Status: She is alert and oriented to person, place, and time.  Psychiatric:        Mood and Affect: Mood normal.        Behavior: Behavior normal.     A/P:  1. Hyperlipidemia, unspecified hyperlipidemia type - last FLP in 07/2020 Refill: - simvastatin (ZOCOR) 20 MG tablet; Take 1 tablet (20 mg total) by mouth daily.  Dispense: 90 tablet; Refill: 3  2. Anxiety - well-controlled Refill: - escitalopram (LEXAPRO) 10 MG tablet; Take 1 tablet (10 mg total) by mouth daily.  Dispense: 90 tablet; Refill: 3  3. Erythema multiforme - started on 02/18/21. Had covid 2 wks prior, started terbinafine 1 week prior, had covid booster 5-6 wks prior - improving on medrol dose pack and topical steroid cream; complete medrol dose pack - cont to hold terbinafine and do not restart until rash resolved and discuss with podiatry   This visit occurred during the SARS-CoV-2 public health emergency.  Safety protocols were in place, including screening questions prior to the visit, additional usage of staff PPE, and extensive cleaning of exam room while observing appropriate contact time as indicated for disinfecting solutions.

## 2021-06-24 DIAGNOSIS — Z01419 Encounter for gynecological examination (general) (routine) without abnormal findings: Secondary | ICD-10-CM | POA: Diagnosis not present

## 2021-06-24 DIAGNOSIS — Z124 Encounter for screening for malignant neoplasm of cervix: Secondary | ICD-10-CM | POA: Diagnosis not present

## 2021-06-24 DIAGNOSIS — Z113 Encounter for screening for infections with a predominantly sexual mode of transmission: Secondary | ICD-10-CM | POA: Diagnosis not present

## 2021-06-24 DIAGNOSIS — Z1231 Encounter for screening mammogram for malignant neoplasm of breast: Secondary | ICD-10-CM | POA: Diagnosis not present

## 2021-06-24 DIAGNOSIS — Z6829 Body mass index (BMI) 29.0-29.9, adult: Secondary | ICD-10-CM | POA: Diagnosis not present

## 2021-08-29 ENCOUNTER — Encounter: Payer: BC Managed Care – PPO | Admitting: Nurse Practitioner

## 2021-09-15 DIAGNOSIS — Z20822 Contact with and (suspected) exposure to covid-19: Secondary | ICD-10-CM | POA: Diagnosis not present

## 2021-10-12 DIAGNOSIS — R052 Subacute cough: Secondary | ICD-10-CM | POA: Diagnosis not present

## 2021-10-24 ENCOUNTER — Ambulatory Visit (INDEPENDENT_AMBULATORY_CARE_PROVIDER_SITE_OTHER): Payer: BC Managed Care – PPO | Admitting: Nurse Practitioner

## 2021-10-24 ENCOUNTER — Encounter: Payer: Self-pay | Admitting: Nurse Practitioner

## 2021-10-24 ENCOUNTER — Other Ambulatory Visit: Payer: Self-pay

## 2021-10-24 VITALS — BP 124/74 | HR 70 | Temp 97.9°F | Wt 169.4 lb

## 2021-10-24 DIAGNOSIS — M791 Myalgia, unspecified site: Secondary | ICD-10-CM

## 2021-10-24 DIAGNOSIS — E785 Hyperlipidemia, unspecified: Secondary | ICD-10-CM

## 2021-10-24 DIAGNOSIS — F419 Anxiety disorder, unspecified: Secondary | ICD-10-CM | POA: Insufficient documentation

## 2021-10-24 DIAGNOSIS — Z8619 Personal history of other infectious and parasitic diseases: Secondary | ICD-10-CM | POA: Insufficient documentation

## 2021-10-24 DIAGNOSIS — E782 Mixed hyperlipidemia: Secondary | ICD-10-CM

## 2021-10-24 DIAGNOSIS — F411 Generalized anxiety disorder: Secondary | ICD-10-CM | POA: Insufficient documentation

## 2021-10-24 LAB — COMPREHENSIVE METABOLIC PANEL
ALT: 28 U/L (ref 0–35)
AST: 25 U/L (ref 0–37)
Albumin: 4.1 g/dL (ref 3.5–5.2)
Alkaline Phosphatase: 73 U/L (ref 39–117)
BUN: 14 mg/dL (ref 6–23)
CO2: 26 mEq/L (ref 19–32)
Calcium: 9.5 mg/dL (ref 8.4–10.5)
Chloride: 108 mEq/L (ref 96–112)
Creatinine, Ser: 0.75 mg/dL (ref 0.40–1.20)
GFR: 85.55 mL/min (ref >60.00)
Glucose, Bld: 108 mg/dL — ABNORMAL HIGH (ref 70–99)
Potassium: 3.8 mEq/L (ref 3.5–5.1)
Sodium: 141 mEq/L (ref 135–145)
Total Bilirubin: 0.3 mg/dL (ref 0.2–1.2)
Total Protein: 6.5 g/dL (ref 6.0–8.3)

## 2021-10-24 LAB — LIPID PANEL
Cholesterol: 205 mg/dL — ABNORMAL HIGH (ref 0–200)
HDL: 42.5 mg/dL (ref >39.00)
NonHDL: 162.32
Total CHOL/HDL Ratio: 5
Triglycerides: 391 mg/dL — ABNORMAL HIGH (ref 0.0–149.0)
VLDL: 78.2 mg/dL — ABNORMAL HIGH (ref 0.0–40.0)

## 2021-10-24 LAB — TSH: TSH: 0.96 u[IU]/mL (ref 0.35–5.50)

## 2021-10-24 LAB — CK: Total CK: 110 U/L (ref 7–177)

## 2021-10-24 LAB — LDL CHOLESTEROL, DIRECT: Direct LDL: 119 mg/dL

## 2021-10-24 MED ORDER — ALPRAZOLAM 0.5 MG PO TABS: ORAL_TABLET | ORAL | 0 refills | Status: DC

## 2021-10-24 MED ORDER — ESCITALOPRAM OXALATE 5 MG PO TABS: 5.0000 mg | ORAL_TABLET | Freq: Every day | ORAL | 3 refills | Status: DC

## 2021-10-24 NOTE — Assessment & Plan Note (Signed)
Stable with lexapro 5mg  daily and alprazolam 1/2tab prn.Last filled 07/07/2020, 30tabs. ? ?Maintain med dose ?Refill sent ? ?

## 2021-10-24 NOTE — Assessment & Plan Note (Addendum)
Repeats myalgia with zocor 20mg . Symptom resolved with discontinuation of medication x 3weeks. ?She now take 1/2tab of zocor per day with minimal symptoms. ?No hx of Dm or HTN or tobacco use. ? ?Repeat lipid panel, CMP, TSh and CK: ?Normal TSH, CK and CMP ?Abnormal lipid panel: elevated TC, trig, and LDL ?Stop zocor. start pravastatin 20mg  daily ?Add fenofibrate 134mg  daily due to triglyceride > 250. ?Heart healthy diet and regular exercise is also important to improve these numbers ?Schedule lab appt to repeat lipid panel in 71months (fasting) ? ?

## 2021-10-24 NOTE — Progress Notes (Signed)
? ?             Established Patient Visit ? ?Patient: Laurie Rangel   DOB: January 24, 1961   61 y.o. Female  MRN: [id number] ?Visit Date: 10/24/2021 ? ?Subjective:  ?  ?Chief Complaint  ?Patient presents with  ? Establish Care  ?  New patient/TOC-Dr. C ?Pt is in need of medication refills (Alprazolam and simvastatin). ?Pt states statin medication has been bothering her joints.  ?Pt states she thinks her Tdap vaccine is up to date and will bring in date for this.  ?Pt has GYN and states her PAP is up to date  ? ?HPI ?Hyperlipidemia ?Repeats myalgia with zocor 20mg . Symptom resolved with discontinuation of medication x 3weeks. ?She now take 1/2tab of zocor per day with minimal symptoms. ?No hx of Dm or HTN or tobacco use. ? ?Repeat lipid panel, CMP, TSh and CK. ?Consider switching to pravastatin? ? ?Anxiety ?Stable with lexapro 5mg  daily and alprazolam 1/2tab prn.Last filled 07/07/2020, 30tabs. ? ?Maintain med dose ?Refill sent ? ?Reviewed medical, surgical, and social history today ? ?Medications: ?Outpatient Medications Prior to Visit  ?Medication Sig  ? Cholecalciferol (VITAMIN D3 SUPER STRENGTH) 50 MCG (2000 UT) CAPS Take by mouth.  ? Multiple Vitamins-Minerals (CENTRUM SILVER ULTRA WOMENS PO) Take by mouth.  ? simvastatin (ZOCOR) 20 MG tablet Take 1 tablet (20 mg total) by mouth daily.  ? [DISCONTINUED] ALPRAZolam (XANAX) 0.5 MG tablet 1/2 - 1 tab po daily PRN  ? [DISCONTINUED] escitalopram (LEXAPRO) 10 MG tablet Take 1 tablet (10 mg total) by mouth daily.  ? [DISCONTINUED] methylPREDNISolone (MEDROL) 4 MG tablet Take 4 mg by mouth daily. (Patient not taking: Reported on 10/24/2021)  ? ?No facility-administered medications prior to visit.  ? ?Reviewed past medical and social history.  ? ?ROS per HPI above ? ? ?   ?Objective:  ?BP 124/74 (BP Location: Left Arm, Patient Position: Sitting, Cuff Size: Large)   Pulse 70   Temp 97.9 ?F (36.6 ?C) (Temporal)   Wt 169 lb 6.4 oz (76.8 kg)   SpO2 96%   BMI 30.01 kg/m?  ? ?   ? ?Physical Exam ?Neck:  ?   Thyroid: No thyroid mass, thyromegaly or thyroid tenderness.  ?Cardiovascular:  ?   Rate and Rhythm: Normal rate.  ?   Pulses: Normal pulses.  ?Pulmonary:  ?   Effort: Pulmonary effort is normal.  ?Lymphadenopathy:  ?   Cervical: No cervical adenopathy.  ?Neurological:  ?   Mental Status: She is alert and oriented to person, place, and time.  ?  ?No results found for any visits on 10/24/21. ?   ?Assessment & Plan:  ?  ?Problem List Items Addressed This Visit   ? ?  ? Other  ? Anxiety  ?  Stable with lexapro 5mg  daily and alprazolam 1/2tab prn.Last filled 07/07/2020, 30tabs. ? ?Maintain med dose ?Refill sent ? ?  ?  ? Relevant Medications  ? escitalopram (LEXAPRO) 5 MG tablet  ? ALPRAZolam (XANAX) 0.5 MG tablet  ? Hyperlipidemia - Primary  ?  Repeats myalgia with zocor 20mg . Symptom resolved with discontinuation of medication x 3weeks. ?She now take 1/2tab of zocor per day with minimal symptoms. ?No hx of Dm or HTN or tobacco use. ? ?Repeat lipid panel, CMP, TSh and CK. ?Consider switching to pravastatin? ?  ?  ? Relevant Orders  ? Lipid panel  ? TSH  ? Comprehensive metabolic panel  ? ?Other Visit Diagnoses   ? ? Myalgia      ?  Relevant Orders  ? TSH  ? CK (Creatine Kinase)  ? ?  ? ?Return in about 6 months (around 04/26/2022) for CPE (fasting). ? ?  ? ?Alysia Penna, NP ? ? ?

## 2021-10-24 NOTE — Patient Instructions (Signed)
Go to lab for blood draw ?Maintain Heart healthy diet and regular exercise. ? ? ?

## 2021-10-27 MED ORDER — PRAVASTATIN SODIUM 20 MG PO TABS
20.0000 mg | ORAL_TABLET | Freq: Every day | ORAL | 1 refills | Status: DC
Start: 2021-10-27 — End: 2022-01-09

## 2021-10-27 MED ORDER — FENOFIBRATE 134 MG PO CAPS: 134.0000 mg | ORAL_CAPSULE | Freq: Every day | ORAL | 1 refills | Status: DC

## 2021-10-27 NOTE — Addendum Note (Signed)
Addended by: Alysia Penna L on: 10/27/2021 12:50 PM ? ? Modules accepted: Orders ? ?

## 2022-01-09 ENCOUNTER — Ambulatory Visit (INDEPENDENT_AMBULATORY_CARE_PROVIDER_SITE_OTHER): Payer: BC Managed Care – PPO | Admitting: Nurse Practitioner

## 2022-01-09 ENCOUNTER — Other Ambulatory Visit (INDEPENDENT_AMBULATORY_CARE_PROVIDER_SITE_OTHER): Payer: BC Managed Care – PPO

## 2022-01-09 ENCOUNTER — Encounter: Payer: Self-pay | Admitting: Nurse Practitioner

## 2022-01-09 VITALS — BP 120/80 | HR 70 | Temp 97.1°F | Ht 63.0 in | Wt 167.6 lb

## 2022-01-09 DIAGNOSIS — E782 Mixed hyperlipidemia: Secondary | ICD-10-CM

## 2022-01-09 DIAGNOSIS — M7989 Other specified soft tissue disorders: Secondary | ICD-10-CM

## 2022-01-09 LAB — LIPID PANEL
Cholesterol: 180 mg/dL (ref 0–200)
HDL: 47.8 mg/dL (ref >39.00)
LDL Cholesterol: 106 mg/dL — ABNORMAL HIGH (ref 0–99)
NonHDL: 131.78
Total CHOL/HDL Ratio: 4
Triglycerides: 131 mg/dL (ref 0.0–149.0)
VLDL: 26.2 mg/dL (ref 0.0–40.0)

## 2022-01-09 MED ORDER — PRAVASTATIN SODIUM 20 MG PO TABS: 20.0000 mg | ORAL_TABLET | Freq: Every day | ORAL | 3 refills | Status: DC

## 2022-01-09 NOTE — Patient Instructions (Addendum)
Improved lipid panel. Stop fenofibrate Continue pravastatin.  You will be contacted to Schedule appt for CT

## 2022-01-09 NOTE — Addendum Note (Signed)
Addended by: Michaela Corner on: 01/09/2022 08:08 PM   Modules accepted: Orders

## 2022-01-09 NOTE — Assessment & Plan Note (Signed)
Repeat lipid panel completed today: improved LDL and triglyceride. Lipid Panel     Component Value Date/Time   CHOL 180 01/09/2022 0806   TRIG 131.0 01/09/2022 0806   HDL 47.80 01/09/2022 0806   CHOLHDL 4 01/09/2022 0806   VLDL 26.2 01/09/2022 0806   LDLCALC 106 (H) 01/09/2022 0806   LDLDIRECT 119.0 10/24/2021 1116   D/c fenofibrate Maintain pravastatin dose

## 2022-01-09 NOTE — Progress Notes (Addendum)
Acute Office Visit  Subjective:    Patient ID: Laurie Rangel, female    DOB: Dec 22, 1960, 61 y.o.   MRN: [id number]  Chief Complaint  Patient presents with   Acute Visit    Lump on right upper arm.  Noticed it 2 days ago, whole arm aches No other concerns    HPI Patient is in today for left upper arm mass, associated with mild pain. First noted 2days ago. She reports arm injury 78months ago (fell on right arm). Denies any paresthesia or weakness or joint swelling.  Hyperlipidemia Repeat lipid panel completed today: improved LDL and triglyceride. Lipid Panel     Component Value Date/Time   CHOL 180 01/09/2022 0806   TRIG 131.0 01/09/2022 0806   HDL 47.80 01/09/2022 0806   CHOLHDL 4 01/09/2022 0806   VLDL 26.2 01/09/2022 0806   LDLCALC 106 (H) 01/09/2022 0806   LDLDIRECT 119.0 10/24/2021 1116   D/c fenofibrate Maintain pravastatin dose   Outpatient Medications Prior to Visit  Medication Sig   ALPRAZolam (XANAX) 0.5 MG tablet 1/2 - 1 tab po daily PRN   Cholecalciferol (VITAMIN D3 SUPER STRENGTH) 50 MCG (2000 UT) CAPS Take by mouth.   escitalopram (LEXAPRO) 5 MG tablet Take 1 tablet (5 mg total) by mouth daily.   Multiple Vitamins-Minerals (CENTRUM SILVER ULTRA WOMENS PO) Take by mouth.   [DISCONTINUED] fenofibrate micronized (LOFIBRA) 134 MG capsule Take 1 capsule (134 mg total) by mouth daily before breakfast.   [DISCONTINUED] pravastatin (PRAVACHOL) 20 MG tablet Take 1 tablet (20 mg total) by mouth at bedtime.   No facility-administered medications prior to visit.    Reviewed past medical and social history.  Review of Systems Per HPI     Objective:    Physical Exam Musculoskeletal:        General: Tenderness present. Normal range of motion.     Right shoulder: Normal.     Right upper arm: Tenderness and bony tenderness present. No swelling, edema or deformity.     Right elbow: Normal.     Right forearm: Normal.       Arms:     Comments: Palpable mass:  tender, mobile, no erythema, no atrophy  Neurological:     Mental Status: She is alert.    BP 120/80 (BP Location: Left Arm, Patient Position: Sitting, Cuff Size: Normal)   Pulse 70   Temp (!) 97.1 F (36.2 C) (Temporal)   Ht 5\' 3"  (1.6 m)   Wt 167 lb 9.6 oz (76 kg)   SpO2 96%   BMI 29.69 kg/m    Results for orders placed or performed in visit on 01/09/22  Lipid panel  Result Value Ref Range   Cholesterol 180 0 - 200 mg/dL   Triglycerides 03/11/22 0.0 - 149.0 mg/dL   HDL 829.9 37.16 mg/dL   VLDL >96.78 0.0 - 93.8 mg/dL   LDL Cholesterol 10.1 (H) 0 - 99 mg/dL   Total CHOL/HDL Ratio 4    NonHDL 131.78       Assessment & Plan:   Problem List Items Addressed This Visit       Other   Hyperlipidemia    Repeat lipid panel completed today: improved LDL and triglyceride. Lipid Panel     Component Value Date/Time   CHOL 180 01/09/2022 0806   TRIG 131.0 01/09/2022 0806   HDL 47.80 01/09/2022 0806   CHOLHDL 4 01/09/2022 0806   VLDL 26.2 01/09/2022 0806   LDLCALC 106 (H) 01/09/2022 03/11/2022  LDLDIRECT 119.0 10/24/2021 1116   D/c fenofibrate Maintain pravastatin dose      Relevant Medications   pravastatin (PRAVACHOL) 20 MG tablet   Other Visit Diagnoses     Mass of soft tissue of right upper extremity    -  Primary   Relevant Orders   MR HUMERUS RIGHT WO CONTRAST      Meds ordered this encounter  Medications   pravastatin (PRAVACHOL) 20 MG tablet    Sig: Take 1 tablet (20 mg total) by mouth at bedtime.    Dispense:  90 tablet    Refill:  3    D/c zocor    Order Specific Question:   Supervising Provider    Answer:   Mliss Sax [[id number]]   Return if symptoms worsen or fail to improve.  Alysia Penna, NP

## 2022-01-18 ENCOUNTER — Ambulatory Visit (HOSPITAL_BASED_OUTPATIENT_CLINIC_OR_DEPARTMENT_OTHER)
Admission: RE | Admit: 2022-01-18 | Discharge: 2022-01-18 | Disposition: A | Discharge status: Home / Self Care | Payer: BC Managed Care – PPO | Source: Ambulatory Visit | Attending: Nurse Practitioner | Admitting: Nurse Practitioner

## 2022-01-18 DIAGNOSIS — M7989 Other specified soft tissue disorders: Secondary | ICD-10-CM | POA: Insufficient documentation

## 2022-01-18 DIAGNOSIS — R2231 Localized swelling, mass and lump, right upper limb: Secondary | ICD-10-CM | POA: Diagnosis not present

## 2022-01-18 DIAGNOSIS — M79621 Pain in right upper arm: Secondary | ICD-10-CM | POA: Diagnosis not present

## 2022-03-07 DIAGNOSIS — H00011 Hordeolum externum right upper eyelid: Secondary | ICD-10-CM | POA: Diagnosis not present

## 2022-04-26 ENCOUNTER — Encounter: Payer: Self-pay | Admitting: Nurse Practitioner

## 2022-04-26 ENCOUNTER — Ambulatory Visit (INDEPENDENT_AMBULATORY_CARE_PROVIDER_SITE_OTHER): Payer: BC Managed Care – PPO | Admitting: Nurse Practitioner

## 2022-04-26 VITALS — BP 102/80 | HR 77 | Temp 97.3°F | Ht 63.0 in | Wt 165.8 lb

## 2022-04-26 DIAGNOSIS — Z23 Encounter for immunization: Secondary | ICD-10-CM | POA: Diagnosis not present

## 2022-04-26 DIAGNOSIS — Z0001 Encounter for general adult medical examination with abnormal findings: Secondary | ICD-10-CM | POA: Diagnosis not present

## 2022-04-26 DIAGNOSIS — R413 Other amnesia: Secondary | ICD-10-CM | POA: Diagnosis not present

## 2022-04-26 DIAGNOSIS — E782 Mixed hyperlipidemia: Secondary | ICD-10-CM | POA: Diagnosis not present

## 2022-04-26 DIAGNOSIS — R739 Hyperglycemia, unspecified: Secondary | ICD-10-CM

## 2022-04-26 DIAGNOSIS — F419 Anxiety disorder, unspecified: Secondary | ICD-10-CM

## 2022-04-26 LAB — COMPREHENSIVE METABOLIC PANEL
ALT: 27 U/L (ref 0–35)
AST: 29 U/L (ref 0–37)
Albumin: 4.3 g/dL (ref 3.5–5.2)
Alkaline Phosphatase: 86 U/L (ref 39–117)
BUN: 18 mg/dL (ref 6–23)
CO2: 23 mEq/L (ref 19–32)
Calcium: 9.3 mg/dL (ref 8.4–10.5)
Chloride: 107 mEq/L (ref 96–112)
Creatinine, Ser: 0.85 mg/dL (ref 0.40–1.20)
GFR: 73.36 mL/min (ref >60.00)
Glucose, Bld: 94 mg/dL (ref 70–99)
Potassium: 4.2 mEq/L (ref 3.5–5.1)
Sodium: 140 mEq/L (ref 135–145)
Total Bilirubin: 0.6 mg/dL (ref 0.2–1.2)
Total Protein: 6.8 g/dL (ref 6.0–8.3)

## 2022-04-26 LAB — CBC
HCT: 42.9 % (ref 36.0–46.0)
Hemoglobin: 14.5 g/dL (ref 12.0–15.0)
MCHC: 33.9 g/dL (ref 30.0–36.0)
MCV: 94.7 fl (ref 78.0–100.0)
Platelets: 265 10*3/uL (ref 150.0–400.0)
RBC: 4.53 Mil/uL (ref 3.87–5.11)
RDW: 13.1 % (ref 11.5–15.5)
WBC: 3.7 10*3/uL — ABNORMAL LOW (ref 4.0–10.5)

## 2022-04-26 LAB — LIPID PANEL
Cholesterol: 200 mg/dL (ref 0–200)
HDL: 45.9 mg/dL (ref >39.00)
LDL Cholesterol: 132 mg/dL — ABNORMAL HIGH (ref 0–99)
NonHDL: 154.2
Total CHOL/HDL Ratio: 4
Triglycerides: 111 mg/dL (ref 0.0–149.0)
VLDL: 22.2 mg/dL (ref 0.0–40.0)

## 2022-04-26 LAB — VITAMIN B12: Vitamin B-12: 319 pg/mL (ref 211–911)

## 2022-04-26 LAB — HEMOGLOBIN A1C: Hgb A1c MFr Bld: 5.9 % (ref 4.6–6.5)

## 2022-04-26 NOTE — Patient Instructions (Signed)
Go to lab  Preventive Care 40-62 Years Old, Female Preventive care refers to lifestyle choices and visits with your health care provider that can promote health and wellness. Preventive care visits are also called wellness exams. What can I expect for my preventive care visit? Counseling Your health care provider may ask you questions about your: Medical history, including: Past medical problems. Family medical history. Pregnancy history. Current health, including: Menstrual cycle. Method of birth control. Emotional well-being. Home life and relationship well-being. Sexual activity and sexual health. Lifestyle, including: Alcohol, nicotine or tobacco, and drug use. Access to firearms. Diet, exercise, and sleep habits. Work and work environment. Sunscreen use. Safety issues such as seatbelt and bike helmet use. Physical exam Your health care provider will check your: Height and weight. These may be used to calculate your BMI (body mass index). BMI is a measurement that tells if you are at a healthy weight. Waist circumference. This measures the distance around your waistline. This measurement also tells if you are at a healthy weight and may help predict your risk of certain diseases, such as type 2 diabetes and high blood pressure. Heart rate and blood pressure. Body temperature. Skin for abnormal spots. What immunizations do I need?  Vaccines are usually given at various ages, according to a schedule. Your health care provider will recommend vaccines for you based on your age, medical history, and lifestyle or other factors, such as travel or where you work. What tests do I need? Screening Your health care provider may recommend screening tests for certain conditions. This may include: Lipid and cholesterol levels. Diabetes screening. This is done by checking your blood sugar (glucose) after you have not eaten for a while (fasting). Pelvic exam and Pap test. Hepatitis B  test. Hepatitis C test. HIV (human immunodeficiency virus) test. STI (sexually transmitted infection) testing, if you are at risk. Lung cancer screening. Colorectal cancer screening. Mammogram. Talk with your health care provider about when you should start having regular mammograms. This may depend on whether you have a family history of breast cancer. BRCA-related cancer screening. This may be done if you have a family history of breast, ovarian, tubal, or peritoneal cancers. Bone density scan. This is done to screen for osteoporosis. Talk with your health care provider about your test results, treatment options, and if necessary, the need for more tests. Follow these instructions at home: Eating and drinking  Eat a diet that includes fresh fruits and vegetables, whole grains, lean protein, and low-fat dairy products. Take vitamin and mineral supplements as recommended by your health care provider. Do not drink alcohol if: Your health care provider tells you not to drink. You are pregnant, may be pregnant, or are planning to become pregnant. If you drink alcohol: Limit how much you have to 0-1 drink a day. Know how much alcohol is in your drink. In the [place] one drink equals one 12 oz bottle of beer (355 mL), one 5 oz glass of wine (148 mL), or one 1 oz glass of hard liquor (44 mL). Lifestyle Brush your teeth every morning and night with fluoride toothpaste. Floss one time each day. Exercise for at least 30 minutes 5 or more days each week. Do not use any products that contain nicotine or tobacco. These products include cigarettes, chewing tobacco, and vaping devices, such as e-cigarettes. If you need help quitting, ask your health care provider. Do not use drugs. If you are sexually active, practice safe sex. Use a condom or other   form of protection to prevent STIs. If you do not wish to become pregnant, use a form of birth control. If you plan to become pregnant, see your health care  provider for a prepregnancy visit. Take aspirin only as told by your health care provider. Make sure that you understand how much to take and what form to take. Work with your health care provider to find out whether it is safe and beneficial for you to take aspirin daily. Find healthy ways to manage stress, such as: Meditation, yoga, or listening to music. Journaling. Talking to a trusted person. Spending time with friends and family. Minimize exposure to UV radiation to reduce your risk of skin cancer. Safety Always wear your seat belt while driving or riding in a vehicle. Do not drive: If you have been drinking alcohol. Do not ride with someone who has been drinking. When you are tired or distracted. While texting. If you have been using any mind-altering substances or drugs. Wear a helmet and other protective equipment during sports activities. If you have firearms in your house, make sure you follow all gun safety procedures. Seek help if you have been physically or sexually abused. What's next? Visit your health care provider once a year for an annual wellness visit. Ask your health care provider how often you should have your eyes and teeth checked. Stay up to date on all vaccines. This information is not intended to replace advice given to you by your health care provider. Make sure you discuss any questions you have with your health care provider. Document Revised: 01/19/2021 Document Reviewed: 01/19/2021 Elsevier Patient Education  [place].

## 2022-04-26 NOTE — Assessment & Plan Note (Addendum)
Repeat lipid panel: elevated LDL increase pravastatin to 40mg  Repeat lipid panel in 57months

## 2022-04-26 NOTE — Assessment & Plan Note (Signed)
stable °

## 2022-04-26 NOTE — Progress Notes (Addendum)
Complete physical exam  Patient: Laurie Rangel   DOB: 03/27/1961   61 y.o. Female  MRN: [id number] Visit Date: 04/28/2022  Subjective:    Chief Complaint  Patient presents with   Annual Exam    CPE Pt fasting C/o memory issues  Flu shot given today    Laurie Rangel is a 61 y.o. female who presents today for a complete physical exam. She reports consuming a general diet.  Walking daily  She generally feels well. She reports sleeping well. She does have additional problems to discuss today.  Vision:Yes Dental:Yes STD Screen:No   Most recent fall risk assessment:    04/26/2022    9:14 AM  Fall Risk   Falls in the past year? 0  Number falls in past yr: 0  Injury with Fall? 0   Most recent depression screenings:    04/26/2022   10:24 AM 10/24/2021   11:27 AM  PHQ 2/9 Scores  PHQ - 2 Score 2 2  PHQ- 9 Score 6 8   HPI  Poor short term memory Chronic but worse in last 22months.  she forgot to pay tax bills, needs to make notes and write lists in order to completed daily tasks. No FHx of Dementia. No change in sleep quality. mood is stable with lexapro. Normal TSH 18months ago  Check B12, rpr, HIV: negative Normal cbc and CMP Entered referral to neurology.  Hyperlipidemia Repeat lipid panel: elevated LDL increase pravastatin to 40mg  Repeat lipid panel in 71months  Anxiety stable Short term memory difficulty, forgets what she goes to the store for, compensates by making notes.     04/26/2022   10:24 AM  MMSE - Mini Mental State Exam  Orientation to time 5  Orientation to Place 5  Registration 3  Attention/ Calculation 5  Recall 2  Language- name 2 objects 2  Language- repeat 1  Language- follow 3 step command 3  Language- read & follow direction 1  Write a sentence 1  Copy design 1  Total score 29    Past Medical History:  Diagnosis Date   Allergy    Depression    Hyperlipidemia    Kidney stone 2011   lithroscopy 2014   Past Surgical History:   Procedure Laterality Date   COLONOSCOPY  01/2010   in 02/2010   laprascopy  1989   exploratory   lithroscopy  2014   kidney stone   Social History   Socioeconomic History   Marital status: Married    Spouse name: Not on file   Number of children: Not on file   Years of education: Not on file   Highest education level: Not on file  Occupational History   Not on file  Tobacco Use   Smoking status: Never   Smokeless tobacco: Never  Vaping Use   Vaping Use: Never used  Substance and Sexual Activity   Alcohol use: Yes    Comment: social   Drug use: Never   Sexual activity: Not on file  Other Topics Concern   Not on file  Social History Narrative   Not on file   Social Determinants of Health   Financial Resource Strain: Not on file  Food Insecurity: Not on file  Transportation Needs: Not on file  Physical Activity: Not on file  Stress: Not on file  Social Connections: Not on file  Intimate Partner Violence: Not on file   Family Status  Relation Name Status   Mother  (Not  Specified)   Father  (Not Specified)   Neg Hx  (Not Specified)   Family History  Problem Relation Age of Onset   Hypertension Mother    Heart disease Mother    Diabetes Father    Colon cancer Neg Hx    Esophageal cancer Neg Hx    Rectal cancer Neg Hx    Stomach cancer Neg Hx    Allergies  Allergen Reactions   Penicillins Hives    Patient Care Team: Aubert Choyce, Bonna Gainsharlotte Lum, NP as PCP - General (Internal Medicine) Shea EvansMody, Vaishali, MD as Consulting Physician (Obstetrics and Gynecology)   Medications: Outpatient Medications Prior to Visit  Medication Sig   ALPRAZolam (XANAX) 0.5 MG tablet 1/2 - 1 tab po daily PRN   Cholecalciferol (VITAMIN D3 SUPER STRENGTH) 50 MCG (2000 UT) CAPS Take by mouth.   escitalopram (LEXAPRO) 5 MG tablet Take 1 tablet (5 mg total) by mouth daily.   Multiple Vitamins-Minerals (CENTRUM SILVER ULTRA WOMENS PO) Take by mouth.   [DISCONTINUED] pravastatin  (PRAVACHOL) 20 MG tablet Take 1 tablet (20 mg total) by mouth at bedtime.   No facility-administered medications prior to visit.    Review of Systems      Objective:  BP 102/80 (BP Location: Right Arm, Patient Position: Sitting, Cuff Size: Normal)   Pulse 77   Temp (!) 97.3 F (36.3 C) (Temporal)   Ht 5\' 3"  (1.6 m)   Wt 165 lb 12.8 oz (75.2 kg)   SpO2 94%   BMI 29.37 kg/m       Physical Exam Vitals reviewed.  Constitutional:      General: She is not in acute distress.    Appearance: She is well-developed.  HENT:     Right Ear: Tympanic membrane, ear canal and external ear normal.     Left Ear: Tympanic membrane, ear canal and external ear normal.     Nose: Nose normal.  Eyes:     Extraocular Movements: Extraocular movements intact.     Conjunctiva/sclera: Conjunctivae normal.  Cardiovascular:     Rate and Rhythm: Normal rate and regular rhythm.     Pulses: Normal pulses.     Heart sounds: Normal heart sounds.  Pulmonary:     Effort: Pulmonary effort is normal. No respiratory distress.     Breath sounds: Normal breath sounds.  Chest:     Chest wall: No tenderness.  Abdominal:     General: Bowel sounds are normal.     Palpations: Abdomen is soft.  Musculoskeletal:        General: Normal range of motion.     Cervical back: Normal range of motion and neck supple.     Right lower leg: No edema.     Left lower leg: No edema.  Neurological:     Mental Status: She is alert and oriented to person, place, and time.     Deep Tendon Reflexes: Reflexes are normal and symmetric.  Psychiatric:        Mood and Affect: Mood normal.        Behavior: Behavior normal.        Thought Content: Thought content normal.     Results for orders placed or performed in visit on 04/26/22  Lipid panel  Result Value Ref Range   Cholesterol 200 0 - 200 mg/dL   Triglycerides 161.0111.0 0.0 - 149.0 mg/dL   HDL 96.0445.90 >54.09>39.00 mg/dL   VLDL 81.122.2 0.0 - 91.440.0 mg/dL   LDL Cholesterol 782132 (H) 0 -  99 mg/dL   Total CHOL/HDL Ratio 4    NonHDL 154.20   Comprehensive metabolic panel  Result Value Ref Range   Sodium 140 135 - 145 mEq/L   Potassium 4.2 3.5 - 5.1 mEq/L   Chloride 107 96 - 112 mEq/L   CO2 23 19 - 32 mEq/L   Glucose, Bld 94 70 - 99 mg/dL   BUN 18 6 - 23 mg/dL   Creatinine, Ser 8.93 0.40 - 1.20 mg/dL   Total Bilirubin 0.6 0.2 - 1.2 mg/dL   Alkaline Phosphatase 86 39 - 117 U/L   AST 29 0 - 37 U/L   ALT 27 0 - 35 U/L   Total Protein 6.8 6.0 - 8.3 g/dL   Albumin 4.3 3.5 - 5.2 g/dL   GFR 73.42 >87.68 mL/min   Calcium 9.3 8.4 - 10.5 mg/dL  Hemoglobin T1X  Result Value Ref Range   Hgb A1c MFr Bld 5.9 4.6 - 6.5 %  CBC  Result Value Ref Range   WBC 3.7 (L) 4.0 - 10.5 K/uL   RBC 4.53 3.87 - 5.11 Mil/uL   Platelets 265.0 150.0 - 400.0 K/uL   Hemoglobin 14.5 12.0 - 15.0 g/dL   HCT 72.6 20.3 - 55.9 %   MCV 94.7 78.0 - 100.0 fl   MCHC 33.9 30.0 - 36.0 g/dL   RDW 74.1 63.8 - 45.3 %  B12  Result Value Ref Range   Vitamin B-12 319 211 - 911 pg/mL  RPR  Result Value Ref Range   RPR Ser Ql NON-REACTIVE NON-REACTIVE  HIV Antibody (routine testing w rflx)  Result Value Ref Range   HIV 1&2 Ab, 4th Generation NON-REACTIVE NON-REACTIVE      Assessment & Plan:    Routine Health Maintenance and Physical Exam  Immunization History  Administered Date(s) Administered   DTP 08/17/2010   Hep A / Hep B 10/15/2001, 12/02/2001, 04/21/2002   Hepatitis B 03/29/2006   Influenza,inj,Quad PF,6+ Mos 05/16/2012, 04/26/2022   Influenza-Unspecified 06/17/2020, 05/07/2021   Meningococcal Mcv4o 07/07/2020   PFIZER(Purple Top)SARS-COV-2 Vaccination 04/17/2019, 05/08/2019, 11/10/2019, 07/14/2020, 01/13/2021   Pneumococcal Polysaccharide-23 01/05/2014   Tdap 07/28/2015   Typhoid Live 12/13/2012   Typhoid Parenteral 08/17/2010   Yellow Fever 12/13/2012   Zoster Recombinat (Shingrix) 06/17/2020, 09/17/2020   Health Maintenance  Topic Date Due   COVID-19 Vaccine (6 - Pfizer series)  05/12/2022 (Originally 03/10/2021)   MAMMOGRAM  06/25/2023   PAP SMEAR-Modifier  06/24/2024   TETANUS/TDAP  07/27/2025   COLONOSCOPY (Pts 45-30yrs Insurance coverage will need to be confirmed)  05/22/2029   INFLUENZA VACCINE  Completed   Hepatitis C Screening  Completed   HIV Screening  Completed   Zoster Vaccines- Shingrix  Completed   HPV VACCINES  Aged Out   Discussed health benefits of physical activity, and encouraged her to engage in regular exercise appropriate for her age and condition.  Problem List Items Addressed This Visit       Other   Anxiety    stable      Hyperlipidemia    Repeat lipid panel: elevated LDL increase pravastatin to 40mg  Repeat lipid panel in 68months      Relevant Medications   pravastatin (PRAVACHOL) 40 MG tablet   Other Relevant Orders   Lipid panel (Completed)   Lipid panel   Poor short term memory    Chronic but worse in last 63months.  she forgot to pay tax bills, needs to make notes and write lists in order to completed daily  tasks. No FHx of Dementia. No change in sleep quality. mood is stable with lexapro. Normal TSH 44months ago  Check B12, rpr, HIV: negative Normal cbc and CMP Entered referral to neurology.      Relevant Orders   B12 (Completed)   RPR (Completed)   HIV Antibody (routine testing w rflx) (Completed)   Ambulatory referral to Neuropsychology   Other Visit Diagnoses     Encounter for preventative adult health care exam with abnormal findings    -  Primary   Relevant Orders   Comprehensive metabolic panel (Completed)   CBC (Completed)   Hyperglycemia       Relevant Orders   Hemoglobin A1c (Completed)   Need for immunization against influenza       Relevant Orders   Flu Vaccine QUAD 6+ mos PF IM (Fluarix Quad PF) (Completed)      Return in about 1 year (around 04/27/2023) for CPE (fasting).     Wilfred Lacy, NP

## 2022-04-26 NOTE — Assessment & Plan Note (Addendum)
Chronic but worse in last 35months.  she forgot to pay tax bills, needs to make notes and write lists in order to completed daily tasks. No FHx of Dementia. No change in sleep quality. mood is stable with lexapro. Normal TSH 80months ago  Check B12, rpr, HIV: negative Normal cbc and CMP Entered referral to neurology.

## 2022-04-27 DIAGNOSIS — H2511 Age-related nuclear cataract, right eye: Secondary | ICD-10-CM | POA: Diagnosis not present

## 2022-04-27 LAB — HIV ANTIBODY (ROUTINE TESTING W REFLEX): HIV 1&2 Ab, 4th Generation: NONREACTIVE

## 2022-04-27 LAB — RPR: RPR Ser Ql: NONREACTIVE

## 2022-04-28 MED ORDER — PRAVASTATIN SODIUM 40 MG PO TABS: 40.0000 mg | ORAL_TABLET | Freq: Every day | ORAL | 3 refills | Status: DC

## 2022-04-28 NOTE — Addendum Note (Signed)
Addended by: Leana Gamer on: 04/28/2022 08:53 PM   Modules accepted: Orders

## 2022-05-02 ENCOUNTER — Telehealth: Payer: Self-pay | Admitting: Nurse Practitioner

## 2022-05-02 NOTE — Telephone Encounter (Signed)
Called & spoke w. Pt , provided lab results

## 2022-05-02 NOTE — Telephone Encounter (Signed)
Pt called and stated that you  called the pt give her lab results . Please call pt back

## 2022-05-04 ENCOUNTER — Encounter: Payer: Self-pay | Admitting: Nurse Practitioner

## 2022-05-08 ENCOUNTER — Encounter: Payer: Self-pay | Admitting: Nurse Practitioner

## 2022-05-14 ENCOUNTER — Encounter: Payer: Self-pay | Admitting: Nurse Practitioner

## 2022-05-15 ENCOUNTER — Encounter: Payer: Self-pay | Admitting: Nurse Practitioner

## 2022-05-15 DIAGNOSIS — H269 Unspecified cataract: Secondary | ICD-10-CM | POA: Insufficient documentation

## 2022-05-19 ENCOUNTER — Encounter: Payer: Self-pay | Admitting: Nurse Practitioner

## 2022-05-23 DIAGNOSIS — E663 Overweight: Secondary | ICD-10-CM | POA: Diagnosis not present

## 2022-05-23 DIAGNOSIS — L821 Other seborrheic keratosis: Secondary | ICD-10-CM | POA: Diagnosis not present

## 2022-05-23 DIAGNOSIS — D1801 Hemangioma of skin and subcutaneous tissue: Secondary | ICD-10-CM | POA: Diagnosis not present

## 2022-05-23 DIAGNOSIS — L720 Epidermal cyst: Secondary | ICD-10-CM | POA: Diagnosis not present

## 2022-06-28 DIAGNOSIS — Z01419 Encounter for gynecological examination (general) (routine) without abnormal findings: Secondary | ICD-10-CM | POA: Diagnosis not present

## 2022-06-28 DIAGNOSIS — Z6829 Body mass index (BMI) 29.0-29.9, adult: Secondary | ICD-10-CM | POA: Diagnosis not present

## 2022-06-28 DIAGNOSIS — Z1231 Encounter for screening mammogram for malignant neoplasm of breast: Secondary | ICD-10-CM | POA: Diagnosis not present

## 2022-06-28 DIAGNOSIS — Z124 Encounter for screening for malignant neoplasm of cervix: Secondary | ICD-10-CM | POA: Diagnosis not present

## 2022-06-28 DIAGNOSIS — Z01411 Encounter for gynecological examination (general) (routine) with abnormal findings: Secondary | ICD-10-CM | POA: Diagnosis not present

## 2022-06-28 LAB — HM MAMMOGRAPHY

## 2022-08-01 ENCOUNTER — Other Ambulatory Visit (INDEPENDENT_AMBULATORY_CARE_PROVIDER_SITE_OTHER): Payer: BC Managed Care – PPO

## 2022-08-01 DIAGNOSIS — E782 Mixed hyperlipidemia: Secondary | ICD-10-CM

## 2022-08-01 LAB — LIPID PANEL
Cholesterol: 239 mg/dL — ABNORMAL HIGH (ref 0–200)
HDL: 44.4 mg/dL (ref >39.00)
LDL Cholesterol: 159 mg/dL — ABNORMAL HIGH (ref 0–99)
NonHDL: 194.37
Total CHOL/HDL Ratio: 5
Triglycerides: 179 mg/dL — ABNORMAL HIGH (ref 0.0–149.0)
VLDL: 35.8 mg/dL (ref 0.0–40.0)

## 2022-08-03 DIAGNOSIS — R413 Other amnesia: Secondary | ICD-10-CM | POA: Diagnosis not present

## 2022-08-03 DIAGNOSIS — R29898 Other symptoms and signs involving the musculoskeletal system: Secondary | ICD-10-CM | POA: Diagnosis not present

## 2022-08-05 ENCOUNTER — Telehealth: Payer: BC Managed Care – PPO | Admitting: Emergency Medicine

## 2022-08-05 ENCOUNTER — Encounter: Payer: Self-pay | Admitting: Nurse Practitioner

## 2022-08-05 DIAGNOSIS — E782 Mixed hyperlipidemia: Secondary | ICD-10-CM

## 2022-08-05 DIAGNOSIS — J329 Chronic sinusitis, unspecified: Secondary | ICD-10-CM | POA: Diagnosis not present

## 2022-08-05 MED ORDER — DOXYCYCLINE HYCLATE 100 MG PO CAPS: 100.0000 mg | ORAL_CAPSULE | Freq: Two times a day (BID) | ORAL | 0 refills | Status: DC

## 2022-08-05 NOTE — Progress Notes (Signed)
E-Visit for Sinus Problems ° °We are sorry that you are not feeling well.  Here is how we plan to help! ° °Based on what you have shared with me it looks like you have sinusitis.  Sinusitis is inflammation and infection in the sinus cavities of the head.  Based on your presentation I believe you most likely have Acute Bacterial Sinusitis.  This is an infection caused by bacteria and is treated with antibiotics. I have prescribed Doxycycline 100mg by mouth twice a day for 10 days. You may use an oral decongestant such as Mucinex D or if you have glaucoma or high blood pressure use plain Mucinex. Saline nasal spray help and can safely be used as often as needed for congestion.  If you develop worsening sinus pain, fever or notice severe headache and vision changes, or if symptoms are not better after completion of antibiotic, please schedule an appointment with a health care provider.   ° °Sinus infections are not as easily transmitted as other respiratory infection, however we still recommend that you avoid close contact with loved ones, especially the very young and elderly.  Remember to wash your hands thoroughly throughout the day as this is the number one way to prevent the spread of infection! ° °Home Care: °Only take medications as instructed by your medical team. °Complete the entire course of an antibiotic. °Do not take these medications with alcohol. °A steam or ultrasonic humidifier can help congestion.  You can place a towel over your head and breathe in the steam from hot water coming from a faucet. °Avoid close contacts especially the very young and the elderly. °Cover your mouth when you cough or sneeze. °Always remember to wash your hands. ° °Get Help Right Away If: °You develop worsening fever or sinus pain. °You develop a severe head ache or visual changes. °Your symptoms persist after you have completed your treatment plan. ° °Make sure you °Understand these instructions. °Will watch your  condition. °Will get help right away if you are not doing well or get worse. ° °Thank you for choosing an e-visit. ° °Your e-visit answers were reviewed by a board certified advanced clinical practitioner to complete your personal care plan. Depending upon the condition, your plan could have included both over the counter or prescription medications. ° °Please review your pharmacy choice. Make sure the pharmacy is open so you can pick up prescription now. If there is a problem, you may contact your provider through MyChart messaging and have the prescription routed to another pharmacy.  Your safety is important to us. If you have drug allergies check your prescription carefully.  ° °For the next 24 hours you can use MyChart to ask questions about today's visit, request a non-urgent call back, or ask for a work or school excuse. °You will get an email in the next two days asking about your experience. I hope that your e-visit has been valuable and will speed your recovery. ° °Approximately 5 minutes was used in reviewing the patient's chart, questionnaire, prescribing medications, and documentation. ° °

## 2022-08-29 ENCOUNTER — Encounter: Payer: Self-pay | Admitting: Nurse Practitioner

## 2022-08-29 ENCOUNTER — Telehealth (INDEPENDENT_AMBULATORY_CARE_PROVIDER_SITE_OTHER): Payer: BC Managed Care – PPO | Admitting: Nurse Practitioner

## 2022-08-29 DIAGNOSIS — J0120 Acute ethmoidal sinusitis, unspecified: Secondary | ICD-10-CM | POA: Diagnosis not present

## 2022-08-29 MED ORDER — GUAIFENESIN ER 600 MG PO TB12: 600.0000 mg | ORAL_TABLET | Freq: Two times a day (BID) | ORAL | 0 refills | Status: DC | PRN

## 2022-08-29 MED ORDER — AZITHROMYCIN 250 MG PO TABS: 250.0000 mg | ORAL_TABLET | Freq: Every day | ORAL | 0 refills | Status: DC

## 2022-08-29 MED ORDER — FLUTICASONE PROPIONATE 50 MCG/ACT NA SUSP: 2.0000 | Freq: Every day | NASAL | 0 refills | Status: DC

## 2022-08-29 NOTE — Assessment & Plan Note (Addendum)
Eval by Neurology: normal. No procrastination, no careless mistakes, no lateness on paying bills or appts or projects or meetings. We discussed possible need for CBT sessions. She opted to waiting at this time

## 2022-08-29 NOTE — Progress Notes (Signed)
Virtual Visit via Video Note  I connected withNAME@ on 08/29/22 at  2:00 PM EST by a video enabled telemedicine application and verified that I am speaking with the correct person using two identifiers.  Location: Patient:Home Provider: Office Participants: patient and provider  I discussed the limitations of evaluation and management by telemedicine and the availability of in person appointments. I also discussed with the patient that there may be a patient responsible charge related to this service. The patient expressed understanding and agreed to proceed.  VQ:XIHWT congestion  History of Present Illness:  Sinusitis This is a recurrent problem. The current episode started more than 1 month ago. The problem has been waxing and waning since onset. There has been no fever. The pain is moderate. Associated symptoms include congestion, headaches and sinus pressure. Pertinent negatives include no chills, coughing, diaphoresis, ear pain, hoarse voice, neck pain, shortness of breath, sneezing, sore throat or swollen glands. Past treatments include antibiotics and oral decongestants. The treatment provided moderate relief.  Completed oral prednisone x 5days and doxycycline x 10days 3weeks ago. "I have yellow/green mucus and drainage from left nostril. There is an odd smell to drainage."  Observations/Objective: Physical Exam Vitals reviewed.  Constitutional:      General: She is not in acute distress. Eyes:     Extraocular Movements: Extraocular movements intact.     Conjunctiva/sclera: Conjunctivae normal.  Pulmonary:     Effort: Pulmonary effort is normal.  Neurological:     Mental Status: She is alert and oriented to person, place, and time.     Assessment and Plan: Honey was seen today for acute visit.  Diagnoses and all orders for this visit:  Acute non-recurrent ethmoidal sinusitis -     azithromycin (ZITHROMAX Z-PAK) 250 MG tablet; Take 1 tablet (250 mg total) by mouth daily.  Take 2tabs on first day, then 1tab once a day till complete -     guaiFENesin (MUCINEX) 600 MG 12 hr tablet; Take 1 tablet (600 mg total) by mouth 2 (two) times daily as needed for cough or to loosen phlegm. -     fluticasone (FLONASE) 50 MCG/ACT nasal spray; Place 2 sprays into both nostrils daily.   Follow Up Instructions: See instructions above   I discussed the assessment and treatment plan with the patient. The patient was provided an opportunity to ask questions and all were answered. The patient agreed with the plan and demonstrated an understanding of the instructions.   The patient was advised to call back or seek an in-person evaluation if the symptoms worsen or if the condition fails to improve as anticipated.  Wilfred Lacy, NP

## 2022-09-01 DIAGNOSIS — H2511 Age-related nuclear cataract, right eye: Secondary | ICD-10-CM | POA: Diagnosis not present

## 2022-09-14 DIAGNOSIS — H269 Unspecified cataract: Secondary | ICD-10-CM | POA: Diagnosis not present

## 2022-09-14 DIAGNOSIS — H2511 Age-related nuclear cataract, right eye: Secondary | ICD-10-CM | POA: Diagnosis not present

## 2022-09-21 ENCOUNTER — Other Ambulatory Visit: Payer: Self-pay | Admitting: Nurse Practitioner

## 2022-09-21 DIAGNOSIS — J0120 Acute ethmoidal sinusitis, unspecified: Secondary | ICD-10-CM

## 2022-09-28 DIAGNOSIS — H2512 Age-related nuclear cataract, left eye: Secondary | ICD-10-CM | POA: Diagnosis not present

## 2022-09-28 DIAGNOSIS — H269 Unspecified cataract: Secondary | ICD-10-CM | POA: Diagnosis not present

## 2022-09-29 ENCOUNTER — Encounter: Payer: Self-pay | Admitting: Nurse Practitioner

## 2022-09-29 DIAGNOSIS — J329 Chronic sinusitis, unspecified: Secondary | ICD-10-CM

## 2022-11-07 DIAGNOSIS — M85852 Other specified disorders of bone density and structure, left thigh: Secondary | ICD-10-CM | POA: Diagnosis not present

## 2022-11-07 DIAGNOSIS — M8589 Other specified disorders of bone density and structure, multiple sites: Secondary | ICD-10-CM | POA: Diagnosis not present

## 2022-11-15 DIAGNOSIS — M8580 Other specified disorders of bone density and structure, unspecified site: Secondary | ICD-10-CM | POA: Diagnosis not present

## 2022-11-16 DIAGNOSIS — J0120 Acute ethmoidal sinusitis, unspecified: Secondary | ICD-10-CM | POA: Diagnosis not present

## 2022-11-16 DIAGNOSIS — J339 Nasal polyp, unspecified: Secondary | ICD-10-CM | POA: Diagnosis not present

## 2022-11-20 ENCOUNTER — Other Ambulatory Visit: Payer: Self-pay | Admitting: Nurse Practitioner

## 2022-11-20 ENCOUNTER — Encounter: Payer: Self-pay | Admitting: *Deleted

## 2022-11-20 DIAGNOSIS — F411 Generalized anxiety disorder: Secondary | ICD-10-CM

## 2022-11-23 ENCOUNTER — Encounter: Payer: Self-pay | Admitting: Nurse Practitioner

## 2022-12-04 DIAGNOSIS — J339 Nasal polyp, unspecified: Secondary | ICD-10-CM | POA: Diagnosis not present

## 2022-12-04 DIAGNOSIS — J0120 Acute ethmoidal sinusitis, unspecified: Secondary | ICD-10-CM | POA: Diagnosis not present

## 2022-12-04 DIAGNOSIS — J320 Chronic maxillary sinusitis: Secondary | ICD-10-CM | POA: Diagnosis not present

## 2022-12-04 DIAGNOSIS — J342 Deviated nasal septum: Secondary | ICD-10-CM | POA: Diagnosis not present

## 2022-12-05 ENCOUNTER — Telehealth: Payer: BC Managed Care – PPO | Admitting: Physician Assistant

## 2022-12-05 DIAGNOSIS — T887XXA Unspecified adverse effect of drug or medicament, initial encounter: Secondary | ICD-10-CM | POA: Diagnosis not present

## 2022-12-05 NOTE — Progress Notes (Signed)
Virtual Visit Consent   Laurie Rangel, you are scheduled for a virtual visit with a [facility] provider today. Just as with appointments in the office, your consent must be obtained to participate. Your consent will be active for this visit and any virtual visit you may have with one of our providers in the next 365 days. If you have a MyChart account, a copy of this consent can be sent to you electronically.  As this is a virtual visit, video technology does not allow for your provider to perform a traditional examination. This may limit your provider's ability to fully assess your condition. If your provider identifies any concerns that need to be evaluated in person or the need to arrange testing (such as labs, EKG, etc.), we will make arrangements to do so. Although advances in technology are sophisticated, we cannot ensure that it will always work on either your end or our end. If the connection with a video visit is poor, the visit may have to be switched to a telephone visit. With either a video or telephone visit, we are not always able to ensure that we have a secure connection.  By engaging in this virtual visit, you consent to the provision of healthcare and authorize for your insurance to be billed (if applicable) for the services provided during this visit. Depending on your insurance coverage, you may receive a charge related to this service.  I need to obtain your verbal consent now. Are you willing to proceed with your visit today? Laurie Rangel has provided verbal consent on 12/05/2022 for a virtual visit (video or telephone). Laurie Loveless, PA-C  Date: 12/05/2022 10:28 AM  Virtual Visit via Video Note   I, Laurie Rangel, connected with  Laurie Rangel  ([id number], 63-13-1962) on 12/05/22 at  9:30 AM EDT by a video-enabled telemedicine application and verified that I am speaking with the correct person using two identifiers.  Location: Patient: Virtual Visit Location Patient:  Home Provider: Virtual Visit Location Provider: Home Office   I discussed the limitations of evaluation and management by telemedicine and the availability of in person appointments. The patient expressed understanding and agreed to proceed.    History of Present Illness: Laurie Rangel is a 62 y.o. who identifies as a female who was assigned female at birth, and is being seen today for possible reaction from recent antibiotic use. Has been having chronic sinus issues and was seen by ENT. Had a sinus culture obtained which showed enterococcus bacteria group (culture result available in CareEverywhere). At the visit, on 11/16/22, she had been placed on Doxycycline 100mg  BID. Once the culture resulted they added Ciprofloxacin. She completed the Ciprofloxacin yesterday. She does have 2 days of Doxycycline remaining.   Over the last few days she noticed there was an area on her right index finger, lateral side, that was starting to peel and burn. Then yesterday she noticed a spot on the right cheek and right side of the nose that was starting to become red and irritated. Today there is a spot that is red on the left hand. The right hand area has continued to peel and become more irritated with the area growing larger. Now extending from the IP joint to just superior of the MCP joint.   Problems:  Patient Active Problem List   Diagnosis Date Noted   Cataracts, bilateral 05/15/2022   Memory deficit 04/26/2022   Anxiety 10/24/2021   Hyperlipidemia 10/24/2021   Hx of herpes zoster 10/24/2021  Sprain of medial collateral ligament of right knee 02/17/2019   Hematoma 02/17/2019   Allergic rhinitis 02/01/2014   Eustachian tube dysfunction 02/01/2014   Nephrolithiasis 2011    Allergies:  Allergies  Allergen Reactions   Penicillins Hives   Medications:  Current Outpatient Medications:    ALPRAZolam (XANAX) 0.5 MG tablet, 1/2 - 1 tab po daily PRN, Disp: 30 tablet, Rfl: 0   azithromycin (ZITHROMAX  Z-PAK) 250 MG tablet, Take 1 tablet (250 mg total) by mouth daily. Take 2tabs on first day, then 1tab once a day till complete, Disp: 6 tablet, Rfl: 0   Cholecalciferol (VITAMIN D3 SUPER STRENGTH) 50 MCG (2000 UT) CAPS, Take by mouth., Disp: , Rfl:    escitalopram (LEXAPRO) 5 MG tablet, TAKE 1 TABLET (5 MG TOTAL) BY MOUTH DAILY., Disp: 90 tablet, Rfl: 3   fluticasone (FLONASE) 50 MCG/ACT nasal spray, SPRAY 2 SPRAYS INTO EACH NOSTRIL EVERY DAY, Disp: 48 mL, Rfl: 1   guaiFENesin (MUCINEX) 600 MG 12 hr tablet, Take 1 tablet (600 mg total) by mouth 2 (two) times daily as needed for cough or to loosen phlegm., Disp: 14 tablet, Rfl: 0   Multiple Vitamins-Minerals (CENTRUM SILVER ULTRA WOMENS PO), Take by mouth., Disp: , Rfl:    pravastatin (PRAVACHOL) 40 MG tablet, Take 1 tablet (40 mg total) by mouth at bedtime., Disp: 90 tablet, Rfl: 3  Observations/Objective: Patient is well-developed, well-nourished in no acute distress.  Resting comfortably at home.  Head is normocephalic, atraumatic.  No labored breathing.  Speech is clear and coherent with logical content.  Patient is alert and oriented at baseline.  Erythematous patch on right cheek that extends over to the right lateral nare Area of raw skin with peeling noted on the right lateral index finger from just superior of the MCP joint extending to just distal of the IP joint. No discharge, no signs of infection  Assessment and Plan: 1. Medication side effect  - Possible SJS vs TDN vs something unrelated like eczema - STOP all antibiotics - Keep area clean and dry - Can moisturize skin with aquaphor - Monitor closely for other areas to peel or slough, if occurs, seek immediate medical attention at the closest ER - Call ENT to inform of reaction and discuss options for sinus infection   Follow Up Instructions: I discussed the assessment and treatment plan with the patient. The patient was provided an opportunity to ask questions and all were  answered. The patient agreed with the plan and demonstrated an understanding of the instructions.  A copy of instructions were sent to the patient via MyChart unless otherwise noted below.    The patient was advised to call back or seek an in-person evaluation if the symptoms worsen or if the condition fails to improve as anticipated.  Time:  I spent 16 minutes with the patient via telehealth technology discussing the above problems/concerns.    Laurie Loveless, PA-C

## 2022-12-05 NOTE — Patient Instructions (Signed)
  Lindaann Slough, thank you for joining [name], PA-C for today's virtual visit.  While this provider is not your primary care provider (PCP), if your PCP is located in our provider database this encounter information will be shared with them immediately following your visit.   A [facility] MyChart account gives you access to today's visit and all your visits, tests, and labs performed at [facility] " click here if you don't have a [facility] MyChart account or go to [email or web address]  Consent: (Patient) Laurie Rangel provided verbal consent for this virtual visit at the beginning of the encounter.  Current Medications:  Current Outpatient Medications:    ALPRAZolam (XANAX) 0.5 MG tablet, 1/2 - 1 tab po daily PRN, Disp: 30 tablet, Rfl: 0   azithromycin (ZITHROMAX Z-PAK) 250 MG tablet, Take 1 tablet (250 mg total) by mouth daily. Take 2tabs on first day, then 1tab once a day till complete, Disp: 6 tablet, Rfl: 0   Cholecalciferol (VITAMIN D3 SUPER STRENGTH) 50 MCG (2000 UT) CAPS, Take by mouth., Disp: , Rfl:    escitalopram (LEXAPRO) 5 MG tablet, TAKE 1 TABLET (5 MG TOTAL) BY MOUTH DAILY., Disp: 90 tablet, Rfl: 3   fluticasone (FLONASE) 50 MCG/ACT nasal spray, SPRAY 2 SPRAYS INTO EACH NOSTRIL EVERY DAY, Disp: 48 mL, Rfl: 1   guaiFENesin (MUCINEX) 600 MG 12 hr tablet, Take 1 tablet (600 mg total) by mouth 2 (two) times daily as needed for cough or to loosen phlegm., Disp: 14 tablet, Rfl: 0   Multiple Vitamins-Minerals (CENTRUM SILVER ULTRA WOMENS PO), Take by mouth., Disp: , Rfl:    pravastatin (PRAVACHOL) 40 MG tablet, Take 1 tablet (40 mg total) by mouth at bedtime., Disp: 90 tablet, Rfl: 3   Medications ordered in this encounter:  No orders of the defined types were placed in this encounter.    *If you need refills on other medications prior to your next appointment, please contact your pharmacy*  Follow-Up: Call back or seek an in-person evaluation if the  symptoms worsen or if the condition fails to improve as anticipated.  [facility] [phone]  Other Instructions STOP Antibiotics CALL ENT   If you have been instructed to have an in-person evaluation today at a local Urgent Care facility, please use the link below. It will take you to a list of all of our available [facility], including address, phone number and hours of operation. Please do not delay care.  [facility]  If you or a family member do not have a primary care provider, use the link below to schedule a visit and establish care. When you choose a [facility] primary care physician or advanced practice provider, you gain a long-term partner in health. Find a Primary Care Provider  Learn more about [facility] in-office and virtual care options: [facility] Get Care Now

## 2022-12-14 ENCOUNTER — Ambulatory Visit: Payer: BC Managed Care – PPO | Admitting: Family Medicine

## 2022-12-28 ENCOUNTER — Other Ambulatory Visit: Payer: Self-pay

## 2022-12-28 ENCOUNTER — Telehealth: Payer: Self-pay | Admitting: Nurse Practitioner

## 2022-12-28 DIAGNOSIS — F411 Generalized anxiety disorder: Secondary | ICD-10-CM

## 2022-12-28 DIAGNOSIS — J0120 Acute ethmoidal sinusitis, unspecified: Secondary | ICD-10-CM | POA: Diagnosis not present

## 2022-12-28 MED ORDER — ESCITALOPRAM OXALATE 5 MG PO TABS: 5.0000 mg | ORAL_TABLET | Freq: Every day | ORAL | 3 refills | Status: DC

## 2022-12-28 NOTE — Telephone Encounter (Signed)
Sent to Pharmacy. 

## 2022-12-28 NOTE — Telephone Encounter (Signed)
Pt needs this med refilled. Her last visit was 08/29/22 I shared that she may need to make an appt to get this refill. She has 6 pills left. [facility] [address]   escitalopram (LEXAPRO) 5 MG tablet [[id number]]

## 2023-02-01 ENCOUNTER — Other Ambulatory Visit (INDEPENDENT_AMBULATORY_CARE_PROVIDER_SITE_OTHER): Payer: BC Managed Care – PPO

## 2023-02-01 ENCOUNTER — Encounter: Payer: Self-pay | Admitting: Nurse Practitioner

## 2023-02-01 ENCOUNTER — Ambulatory Visit (INDEPENDENT_AMBULATORY_CARE_PROVIDER_SITE_OTHER): Payer: BC Managed Care – PPO | Admitting: Nurse Practitioner

## 2023-02-01 VITALS — BP 110/80 | HR 64 | Temp 98.4°F | Resp 16 | Ht 63.0 in | Wt 161.2 lb

## 2023-02-01 DIAGNOSIS — L03818 Cellulitis of other sites: Secondary | ICD-10-CM

## 2023-02-01 DIAGNOSIS — E781 Pure hyperglyceridemia: Secondary | ICD-10-CM | POA: Diagnosis not present

## 2023-02-01 DIAGNOSIS — T2111XA Burn of first degree of chest wall, initial encounter: Secondary | ICD-10-CM

## 2023-02-01 DIAGNOSIS — E782 Mixed hyperlipidemia: Secondary | ICD-10-CM | POA: Diagnosis not present

## 2023-02-01 LAB — LIPID PANEL
Cholesterol: 205 mg/dL — ABNORMAL HIGH (ref 0–200)
HDL: 37.6 mg/dL — ABNORMAL LOW (ref >39.00)
NonHDL: 167.4
Total CHOL/HDL Ratio: 5
Triglycerides: 318 mg/dL — ABNORMAL HIGH (ref 0.0–149.0)
VLDL: 63.6 mg/dL — ABNORMAL HIGH (ref 0.0–40.0)

## 2023-02-01 LAB — LDL CHOLESTEROL, DIRECT: Direct LDL: 124 mg/dL

## 2023-02-01 MED ORDER — DOXYCYCLINE HYCLATE 100 MG PO TABS: 100.0000 mg | ORAL_TABLET | Freq: Two times a day (BID) | ORAL | 0 refills | Status: AC

## 2023-02-01 MED ORDER — MUPIROCIN 2 % EX OINT: 1.0000 | TOPICAL_OINTMENT | Freq: Two times a day (BID) | CUTANEOUS | 0 refills | Status: AC

## 2023-02-01 NOTE — Patient Instructions (Addendum)
Start probiotics: curturelle or Align or florastor 1cap daily. Call office if no improvement in 1week  Cellulitis, Adult  Cellulitis is a skin infection. The infected area is often warm, red, swollen, and sore. It occurs most often on the legs, feet, and toes, but can happen on any part of the body. This condition can be life-threatening without treatment. It is very important to get treated right away. What are the causes? This condition is caused by bacteria. The bacteria enter through a break in the skin, such as: A cut. A burn. A bug bite. An animal bite. An open sore. A crack. What increases the risk? Having a weak body's defense system (immune system). Being older than 63 years old. Having a blood sugar problem (diabetes). Having a long-term liver disease (cirrhosis) or kidney disease. Being very overweight (obese). Having a skin problem, such as: An itchy rash. A rash caused by a fungus. A rash with blisters. Slow movement of blood in the veins (venous stasis). Fluid buildup below the skin (edema). This condition is more likely to occur in people who: Have open cuts, burns, bites, or scrapes on the skin. Have been treated with high-energy rays (radiation). Use IV drugs. What are the signs or symptoms? Skin that: Looks red or purple, or slightly darker than your usual skin color. Has streaks. Has spots. Is swollen. Is sore or painful when you touch it. Is warm. A fever. Chills. Blisters. Tiredness (fatigue). How is this treated? Medicines to treat infections or allergies. Rest. Placing cold or warm cloths on the skin. Staying in the hospital, if the condition is very bad. You may need medicines through an IV. Follow these instructions at home: Medicines Take over-the-counter and prescription medicines only as told by your doctor. If you were prescribed antibiotics, take them as told by your doctor. Do not stop using them even if you start to feel  better. General instructions Drink enough fluid to keep your pee (urine) pale yellow. Do not touch or rub the infected area. Raise (elevate) the infected area above the level of your heart while you are sitting or lying down. Return to your normal activities when your doctor says that it is safe. Place cold or warm cloths on the area as told by your doctor. Keep all follow-up visits. Your doctor will need to make sure that a more serious infection is not developing. Contact a doctor if: You have a fever. You do not start to get better after 1-2 days of treatment. Your bone or joint under the infected area starts to hurt after the skin has healed. Your infection comes back in the same area or another area. Signs of this may include: You have a swollen bump in the area. Your red area gets larger, turns dark in color, or hurts more. You have more fluid coming from the wound. Pus or a bad smell develops in your infected area. You have more pain. You feel sick and have muscle aches and weakness. You develop vomiting or watery poop that will not go away. Get help right away if: You see red streaks coming from the area. You notice the skin turns purple or black and falls off. These symptoms may be an emergency. Get help right away. Call 911. Do not wait to see if the symptoms will go away. Do not drive yourself to the hospital. This information is not intended to replace advice given to you by your health care provider. Make sure you discuss any questions  you have with your health care provider. Document Revised: 03/21/2022 Document Reviewed: 03/21/2022 Elsevier Patient Education  2024 [employer].

## 2023-02-01 NOTE — Progress Notes (Signed)
                Established Patient Visit  Patient: Laurie Rangel   DOB: 1960/12/30   62 y.o. Female  MRN: [id number] Visit Date: 02/01/2023  Subjective:    Chief Complaint  Patient presents with   Wound Check    Side of breast    Burn The incident occurred 3 to 5 days ago. Incident location: curling iron. The burns occurred while hairstyling. The burns were a result of contact with a hot surface. Burn location: right breast. The patient is experiencing no pain. Treatments tried: topical antiobiotics. The treatment provided no relief.   Reviewed medical, surgical, and social history today  Medications: Outpatient Medications Prior to Visit  Medication Sig   ALPRAZolam (XANAX) 0.5 MG tablet 1/2 - 1 tab po daily PRN   Cholecalciferol (VITAMIN D3 SUPER STRENGTH) 50 MCG (2000 UT) CAPS Take by mouth.   escitalopram (LEXAPRO) 5 MG tablet Take 1 tablet (5 mg total) by mouth daily.   fluticasone (FLONASE) 50 MCG/ACT nasal spray SPRAY 2 SPRAYS INTO EACH NOSTRIL EVERY DAY   Multiple Vitamins-Minerals (CENTRUM SILVER ULTRA WOMENS PO) Take by mouth.   pravastatin (PRAVACHOL) 40 MG tablet Take 1 tablet (40 mg total) by mouth at bedtime.   azithromycin (ZITHROMAX Z-PAK) 250 MG tablet Take 1 tablet (250 mg total) by mouth daily. Take 2tabs on first day, then 1tab once a day till complete (Patient not taking: Reported on 02/01/2023)   guaiFENesin (MUCINEX) 600 MG 12 hr tablet Take 1 tablet (600 mg total) by mouth 2 (two) times daily as needed for cough or to loosen phlegm. (Patient not taking: Reported on 02/01/2023)   No facility-administered medications prior to visit.   Reviewed past medical and social history.   ROS per HPI above      Objective:  BP 110/80 (BP Location: Left Arm, Patient Position: Sitting, Cuff Size: Large)   Pulse 64   Temp 98.4 F (36.9 C) (Temporal)   Resp 16   Ht 5\' 3"  (1.6 m)   Wt 161 lb 3.2 oz (73.1 kg)   SpO2 96%   BMI 28.56 kg/m      Physical Exam Vitals  reviewed.  Cardiovascular:     Rate and Rhythm: Normal rate.     Pulses: Normal pulses.  Pulmonary:     Effort: Pulmonary effort is normal.  Chest:       Comments: Erythema without induration or mass or nipple discharge Lymphadenopathy:     Upper Body:     Right upper body: No supraclavicular, axillary or pectoral adenopathy.  Neurological:     Mental Status: She is alert and oriented to person, place, and time.     No results found for any visits on 02/01/23.    Assessment & Plan:    Problem List Items Addressed This Visit   None Visit Diagnoses     Superficial burn of breast, initial encounter    -  Primary   Relevant Medications   mupirocin ointment (BACTROBAN) 2 %   doxycycline (VIBRA-TABS) 100 MG tablet   Cellulitis of other specified site       Relevant Medications   doxycycline (VIBRA-TABS) 100 MG tablet     Advised to return to office if no improvement in 1week.  Return if symptoms worsen or fail to improve.     Alysia Penna, NP

## 2023-02-02 ENCOUNTER — Other Ambulatory Visit: Payer: Self-pay | Admitting: Nurse Practitioner

## 2023-02-02 MED ORDER — FENOFIBRATE 134 MG PO CAPS
134.0000 mg | ORAL_CAPSULE | Freq: Every day | ORAL | 1 refills | Status: DC
Start: 2023-02-02 — End: 2023-07-11

## 2023-02-02 NOTE — Addendum Note (Signed)
Addended by: Michaela Corner on: 02/02/2023 03:21 PM   Modules accepted: Orders

## 2023-03-15 ENCOUNTER — Other Ambulatory Visit: Payer: Self-pay | Admitting: Nurse Practitioner

## 2023-03-15 DIAGNOSIS — T2111XA Burn of first degree of chest wall, initial encounter: Secondary | ICD-10-CM

## 2023-03-16 DIAGNOSIS — Z23 Encounter for immunization: Secondary | ICD-10-CM | POA: Diagnosis not present

## 2023-03-16 DIAGNOSIS — Z7184 Encounter for health counseling related to travel: Secondary | ICD-10-CM | POA: Diagnosis not present

## 2023-03-20 IMAGING — MR MR HUMERUS*R* W/O CM
5 series · 40 of 40 positions shown · non-contrast
Comparison: None Available.

CLINICAL DATA: Upper arm soft tissue mass with associated pain for
2 weeks. Patient reports falling 4 weeks ago. No previous relevant
surgery.

EXAM:
MRI OF THE RIGHT HUMERUS WITHOUT CONTRAST
TECHNIQUE: Multiplanar, multisequence MR imaging of the right upper arm was
performed. No intravenous contrast was administered.

[Series 9: T1 · coronal · 4.0mm · 0.68mm/px · 5 of 19 slices shown (1 of 3)]
[im 1/19]
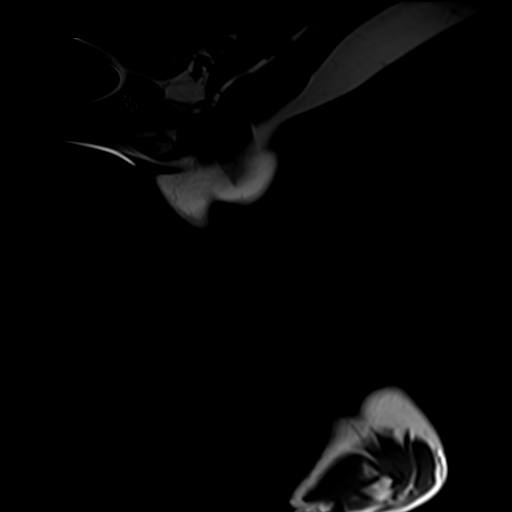
[im 5/19]
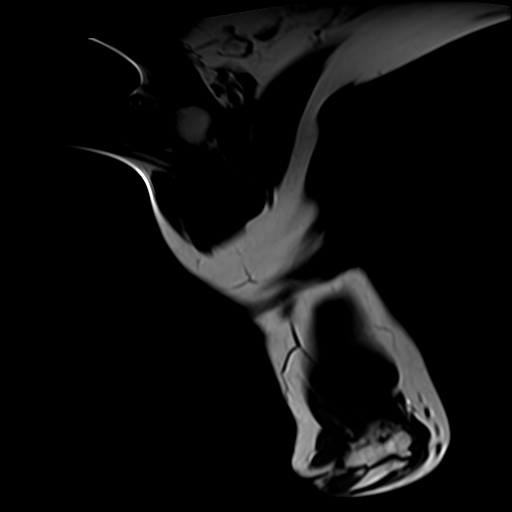
[im 10/19]
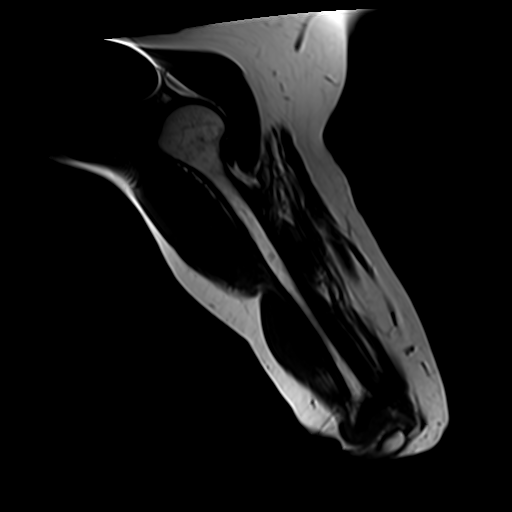
[im 14/19]
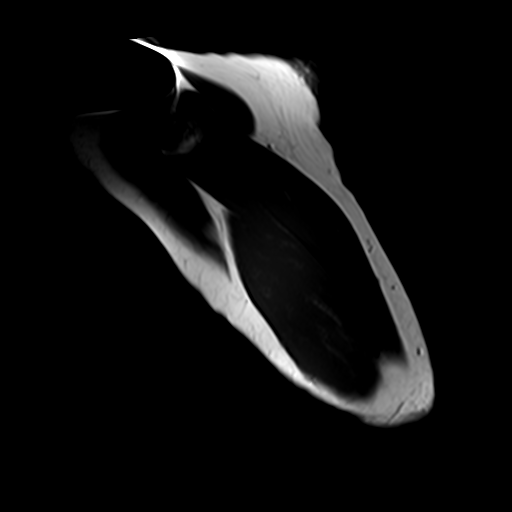
[im 19/19]
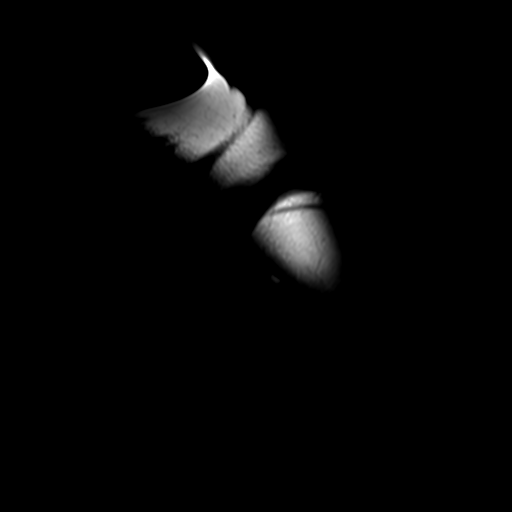

[Series 10: STIR · coronal · 4.0mm · 1.37mm/px · 5 of 19 slices shown (1 of 2)]
[im 1/19]
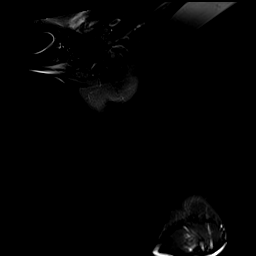
[im 5/19]
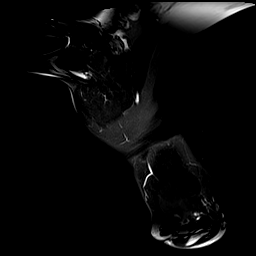
[im 10/19]
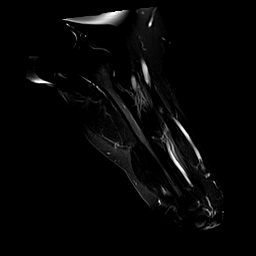
[im 14/19]
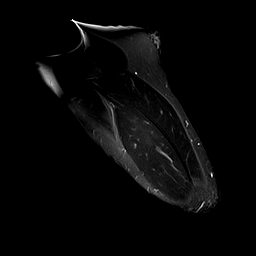
[im 19/19]
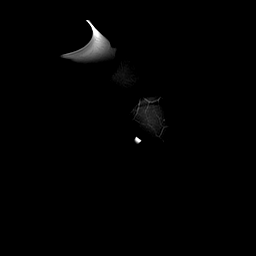

[Series 11: T1 · oblique · 4.0mm · 0.78mm/px · 8 of 29 slices shown (2 of 3)]
[im 1/29]
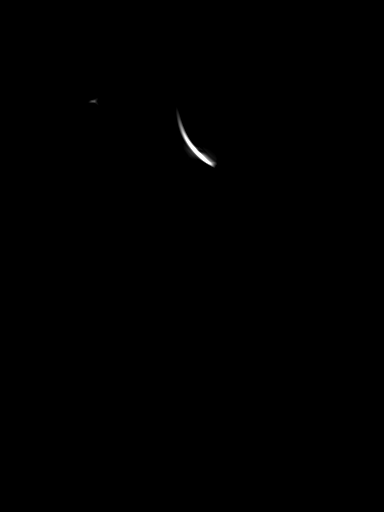
[im 5/29]
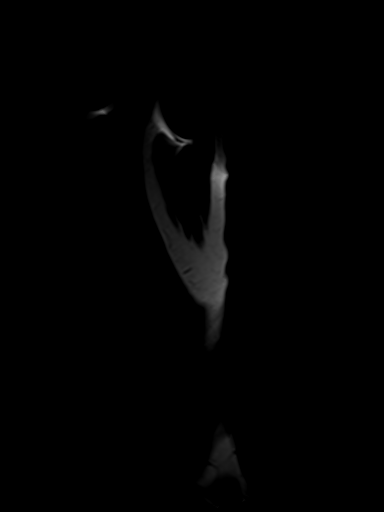
[im 9/29]
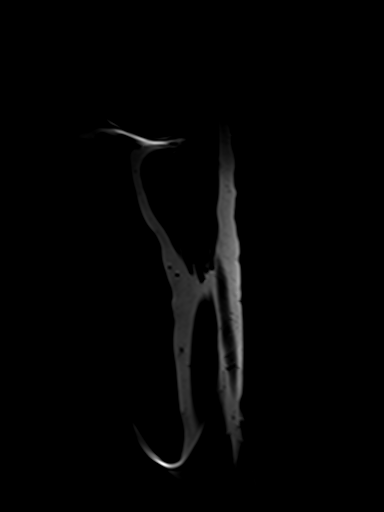
[im 13/29]
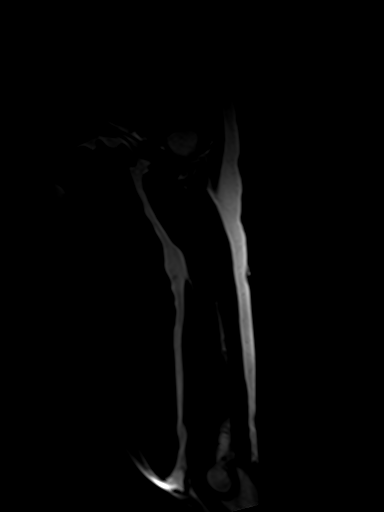
[im 17/29]
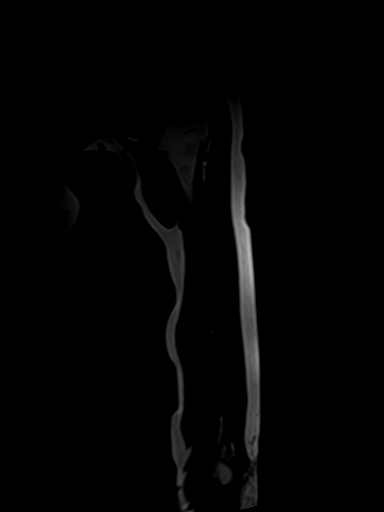
[im 21/29]
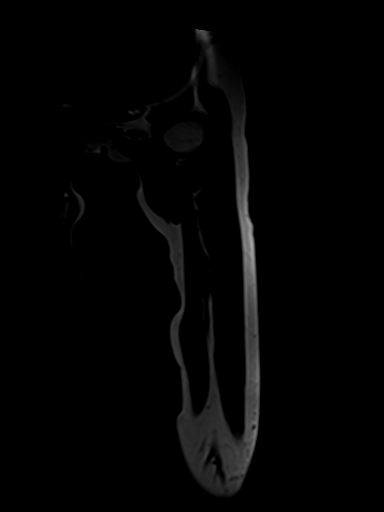
[im 25/29]
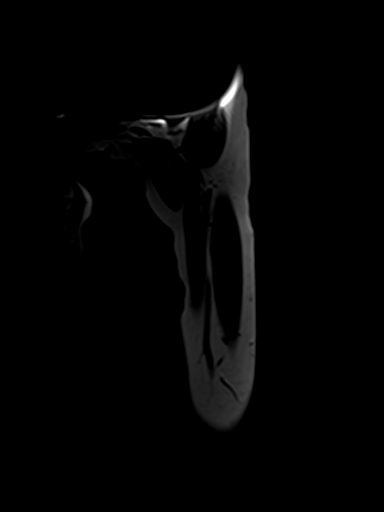
[im 29/29]
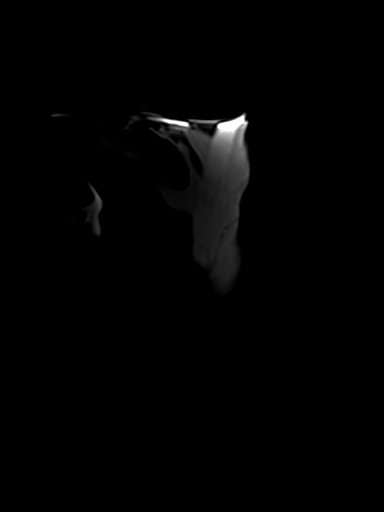

[Series 14: STIR · axial · 5.0mm · 0.70mm/px · z∈[-104,+81]mm · 11 of 38 slices shown (2 of 2)]
[im 1/38]
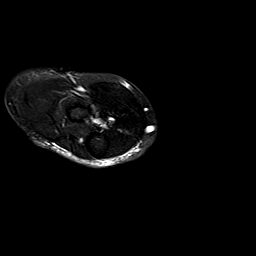
[im 4/38]
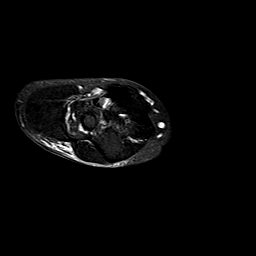
[im 8/38]
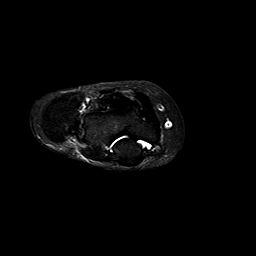
[im 12/38]
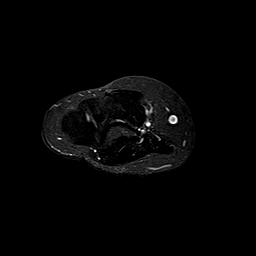
[im 15/38]
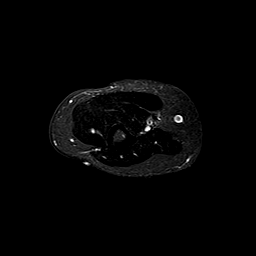
[im 19/38]
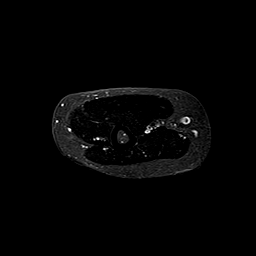
[im 23/38]
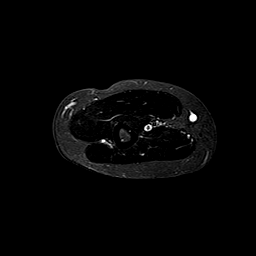
[im 26/38]
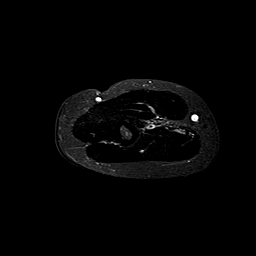
[im 30/38]
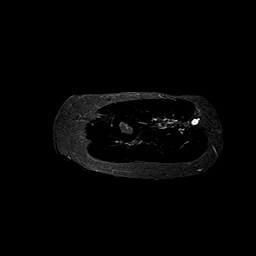
[im 34/38]
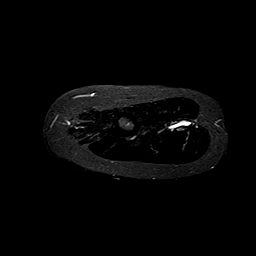
[im 38/38]
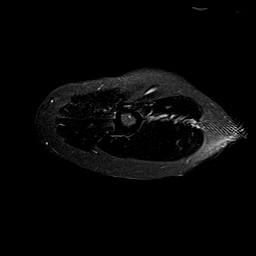

[Series 15: T1 · axial · 5.0mm · 0.70mm/px · z∈[-104,+81]mm · 11 of 38 slices shown (3 of 3)]
[im 1/38]
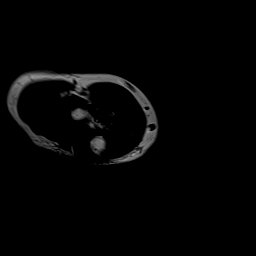
[im 4/38]
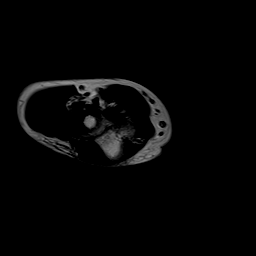
[im 8/38]
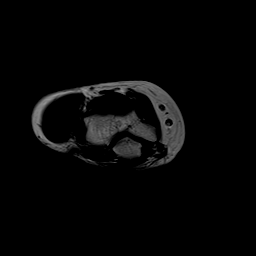
[im 12/38]
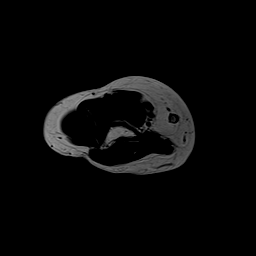
[im 15/38]
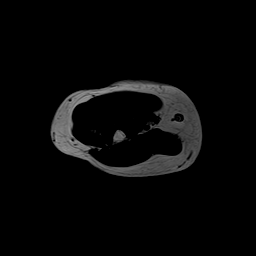
[im 19/38]
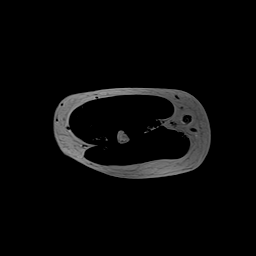
[im 23/38]
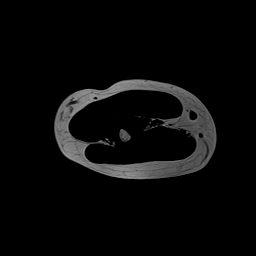
[im 26/38]
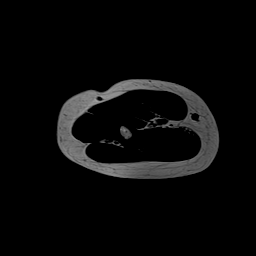
[im 30/38]
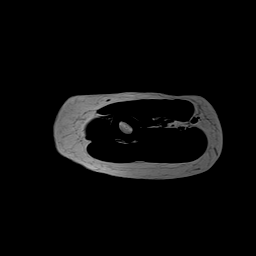
[im 34/38]
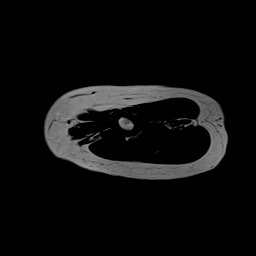
[im 38/38]
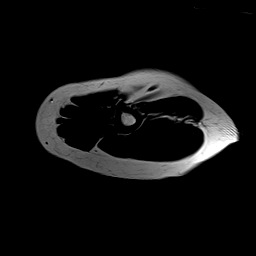

[40 of 40 positions shown; findings below may reference images not displayed]

FINDINGS: Capsules were placed along the patient's palpable concern
posterolaterally in the distal upper arm. No contrast was
administered.

Bones/Joint/Cartilage

The humerus appears normal. The shoulder and elbow joints are
incompletely visualized, but appear unremarkable.

Ligaments

Not relevant for exam/indication.

Muscles and Tendons

Normal.

Soft tissues

The subcutaneous fat in the area of concern appears normal. No soft
tissue mass, fluid collection or inflammatory changes identified.
There is mild nonspecific subcutaneous edema more distally in the
proximal forearm. No evidence of foreign body.
IMPRESSION: 1. No evidence of soft tissue mass to correspond with the patient's
palpable concern. Clinical follow up of any palpable concern
recommended.
2. The right upper arm musculature and right humerus appear normal.

## 2023-03-22 ENCOUNTER — Encounter (INDEPENDENT_AMBULATORY_CARE_PROVIDER_SITE_OTHER): Payer: Self-pay

## 2023-04-14 ENCOUNTER — Encounter (INDEPENDENT_AMBULATORY_CARE_PROVIDER_SITE_OTHER): Payer: BC Managed Care – PPO | Admitting: Nurse Practitioner

## 2023-04-14 DIAGNOSIS — G43009 Migraine without aura, not intractable, without status migrainosus: Secondary | ICD-10-CM

## 2023-04-16 DIAGNOSIS — G43009 Migraine without aura, not intractable, without status migrainosus: Secondary | ICD-10-CM

## 2023-04-17 DIAGNOSIS — G43009 Migraine without aura, not intractable, without status migrainosus: Secondary | ICD-10-CM | POA: Insufficient documentation

## 2023-04-17 MED ORDER — PROMETHAZINE HCL 25 MG PO TABS
25.0000 mg | ORAL_TABLET | Freq: Every day | ORAL | 0 refills | Status: DC | PRN
Start: 2023-04-17 — End: 2023-07-04

## 2023-04-17 NOTE — Telephone Encounter (Signed)
Please see the MyChart message reply(ies) for my assessment and plan.  The patient gave consent for this Medical Advice Message and is aware that it may result in a bill to their insurance company as well as the possibility that this may result in a co-payment or deductible. They are an established patient, but are not seeking medical advice exclusively about a problem treated during an in person or video visit in the last 7 days. I did not recommend an in person or video visit within 7 days of my reply.  I spent a total of 15 minutes cumulative time within 7 days through Baggs, NP

## 2023-04-17 NOTE — Assessment & Plan Note (Signed)
Onset at age 62, occurs 6x/year, associated with nausea, no aura Location of pain: frontal and behind eyes. Resolved with use of NSAID, caffeine and promethazine. no previous head injury, no previous brain imaging. No Fhx of migraine No personal hx of seizures  Promethazine rx sent

## 2023-04-17 NOTE — Addendum Note (Signed)
Addended by: Michaela Corner on: 04/17/2023 02:44 PM   Modules accepted: Orders

## 2023-04-30 ENCOUNTER — Ambulatory Visit: Payer: BC Managed Care – PPO | Admitting: Nurse Practitioner

## 2023-04-30 ENCOUNTER — Encounter: Payer: Self-pay | Admitting: Nurse Practitioner

## 2023-04-30 VITALS — BP 140/90 | HR 67 | Temp 98.1°F | Ht 63.0 in | Wt 167.6 lb

## 2023-04-30 DIAGNOSIS — Z0000 Encounter for general adult medical examination without abnormal findings: Secondary | ICD-10-CM | POA: Diagnosis not present

## 2023-04-30 DIAGNOSIS — Z23 Encounter for immunization: Secondary | ICD-10-CM | POA: Diagnosis not present

## 2023-04-30 DIAGNOSIS — Z0001 Encounter for general adult medical examination with abnormal findings: Secondary | ICD-10-CM

## 2023-04-30 DIAGNOSIS — R030 Elevated blood-pressure reading, without diagnosis of hypertension: Secondary | ICD-10-CM | POA: Diagnosis not present

## 2023-04-30 DIAGNOSIS — E781 Pure hyperglyceridemia: Secondary | ICD-10-CM | POA: Diagnosis not present

## 2023-04-30 DIAGNOSIS — D72819 Decreased white blood cell count, unspecified: Secondary | ICD-10-CM | POA: Insufficient documentation

## 2023-04-30 LAB — CBC WITH DIFFERENTIAL/PLATELET
Basophils Absolute: 0 10*3/uL (ref 0.0–0.1)
Basophils Relative: 0.9 % (ref 0.0–3.0)
Eosinophils Absolute: 0.2 10*3/uL (ref 0.0–0.7)
Eosinophils Relative: 5.7 % — ABNORMAL HIGH (ref 0.0–5.0)
HCT: 41 % (ref 36.0–46.0)
Hemoglobin: 13.6 g/dL (ref 12.0–15.0)
Lymphocytes Relative: 26.7 % (ref 12.0–46.0)
Lymphs Abs: 0.9 10*3/uL (ref 0.7–4.0)
MCHC: 33.2 g/dL (ref 30.0–36.0)
MCV: 94.5 fl (ref 78.0–100.0)
Monocytes Absolute: 0.3 10*3/uL (ref 0.1–1.0)
Monocytes Relative: 8.8 % (ref 3.0–12.0)
Neutro Abs: 2 10*3/uL (ref 1.4–7.7)
Neutrophils Relative %: 57.9 % (ref 43.0–77.0)
Platelets: 264 10*3/uL (ref 150.0–400.0)
RBC: 4.34 Mil/uL (ref 3.87–5.11)
RDW: 13.2 % (ref 11.5–15.5)
WBC: 3.5 10*3/uL — ABNORMAL LOW (ref 4.0–10.5)

## 2023-04-30 LAB — COMPREHENSIVE METABOLIC PANEL
ALT: 21 U/L (ref 0–35)
AST: 24 U/L (ref 0–37)
Albumin: 4.3 g/dL (ref 3.5–5.2)
Alkaline Phosphatase: 64 U/L (ref 39–117)
BUN: 14 mg/dL (ref 6–23)
CO2: 25 mEq/L (ref 19–32)
Calcium: 9.1 mg/dL (ref 8.4–10.5)
Chloride: 108 mEq/L (ref 96–112)
Creatinine, Ser: 0.81 mg/dL (ref 0.40–1.20)
GFR: 77.18 mL/min (ref >60.00)
Glucose, Bld: 103 mg/dL — ABNORMAL HIGH (ref 70–99)
Potassium: 3.9 mEq/L (ref 3.5–5.1)
Sodium: 141 mEq/L (ref 135–145)
Total Bilirubin: 0.4 mg/dL (ref 0.2–1.2)
Total Protein: 6.5 g/dL (ref 6.0–8.3)

## 2023-04-30 LAB — LIPID PANEL
Cholesterol: 180 mg/dL (ref 0–200)
HDL: 57.1 mg/dL (ref >39.00)
LDL Cholesterol: 95 mg/dL (ref 0–99)
NonHDL: 123.18
Total CHOL/HDL Ratio: 3
Triglycerides: 141 mg/dL (ref 0.0–149.0)
VLDL: 28.2 mg/dL (ref 0.0–40.0)

## 2023-04-30 LAB — TSH: TSH: 1.61 u[IU]/mL (ref 0.35–5.50)

## 2023-04-30 NOTE — Assessment & Plan Note (Signed)
Repeat CBC with diff

## 2023-04-30 NOTE — Patient Instructions (Signed)
Monitor BP at home once a day Send BP readings via mychart on Thursday. Maintain DASH diet and daily exercise Schedule appointment with GYN for repeat PAP Go to lab  Preventive Care 16-63 Years Old, Female Preventive care refers to lifestyle choices and visits with your health care provider that can promote health and wellness. Preventive care visits are also called wellness exams. What can I expect for my preventive care visit? Counseling Your health care provider may ask you questions about your: Medical history, including: Past medical problems. Family medical history. Pregnancy history. Current health, including: Menstrual cycle. Method of birth control. Emotional well-being. Home life and relationship well-being. Sexual activity and sexual health. Lifestyle, including: Alcohol, nicotine or tobacco, and drug use. Access to firearms. Diet, exercise, and sleep habits. Work and work Astronomer. Sunscreen use. Safety issues such as seatbelt and bike helmet use. Physical exam Your health care provider will check your: Height and weight. These may be used to calculate your BMI (body mass index). BMI is a measurement that tells if you are at a healthy weight. Waist circumference. This measures the distance around your waistline. This measurement also tells if you are at a healthy weight and may help predict your risk of certain diseases, such as type 2 diabetes and high blood pressure. Heart rate and blood pressure. Body temperature. Skin for abnormal spots. What immunizations do I need?  Vaccines are usually given at various ages, according to a schedule. Your health care provider will recommend vaccines for you based on your age, medical history, and lifestyle or other factors, such as travel or where you work. What tests do I need? Screening Your health care provider may recommend screening tests for certain conditions. This may include: Lipid and cholesterol  levels. Diabetes screening. This is done by checking your blood sugar (glucose) after you have not eaten for a while (fasting). Pelvic exam and Pap test. Hepatitis B test. Hepatitis C test. HIV (human immunodeficiency virus) test. STI (sexually transmitted infection) testing, if you are at risk. Lung cancer screening. Colorectal cancer screening. Mammogram. Talk with your health care provider about when you should start having regular mammograms. This may depend on whether you have a family history of breast cancer. BRCA-related cancer screening. This may be done if you have a family history of breast, ovarian, tubal, or peritoneal cancers. Bone density scan. This is done to screen for osteoporosis. Talk with your health care provider about your test results, treatment options, and if necessary, the need for more tests. Follow these instructions at home: Eating and drinking  Eat a diet that includes fresh fruits and vegetables, whole grains, lean protein, and low-fat dairy products. Take vitamin and mineral supplements as recommended by your health care provider. Do not drink alcohol if: Your health care provider tells you not to drink. You are pregnant, may be pregnant, or are planning to become pregnant. If you drink alcohol: Limit how much you have to 0-1 drink a day. Know how much alcohol is in your drink. In the [place] one drink equals one 12 oz bottle of beer (355 mL), one 5 oz glass of wine (148 mL), or one 1 oz glass of hard liquor (44 mL). Lifestyle Brush your teeth every morning and night with fluoride toothpaste. Floss one time each day. Exercise for at least 30 minutes 5 or more days each week. Do not use any products that contain nicotine or tobacco. These products include cigarettes, chewing tobacco, and vaping devices, such as e-cigarettes.  If you need help quitting, ask your health care provider. Do not use drugs. If you are sexually active, practice safe sex. Use a  condom or other form of protection to prevent STIs. If you do not wish to become pregnant, use a form of birth control. If you plan to become pregnant, see your health care provider for a prepregnancy visit. Take aspirin only as told by your health care provider. Make sure that you understand how much to take and what form to take. Work with your health care provider to find out whether it is safe and beneficial for you to take aspirin daily. Find healthy ways to manage stress, such as: Meditation, yoga, or listening to music. Journaling. Talking to a trusted person. Spending time with friends and family. Minimize exposure to UV radiation to reduce your risk of skin cancer. Safety Always wear your seat belt while driving or riding in a vehicle. Do not drive: If you have been drinking alcohol. Do not ride with someone who has been drinking. When you are tired or distracted. While texting. If you have been using any mind-altering substances or drugs. Wear a helmet and other protective equipment during sports activities. If you have firearms in your house, make sure you follow all gun safety procedures. Seek help if you have been physically or sexually abused. What's next? Visit your health care provider once a year for an annual wellness visit. Ask your health care provider how often you should have your eyes and teeth checked. Stay up to date on all vaccines. This information is not intended to replace advice given to you by your health care provider. Make sure you discuss any questions you have with your health care provider. Document Revised: 01/19/2021 Document Reviewed: 01/19/2021 Elsevier Patient Education  2024 [employer].

## 2023-04-30 NOTE — Assessment & Plan Note (Signed)
Repeat lipid panel and CMP today  maintain pravastatin and fenofibrate dose

## 2023-04-30 NOTE — Assessment & Plan Note (Signed)
Elevated today BP Readings from Last 3 Encounters:  04/30/23 (!) 140/90  02/01/23 110/80  04/26/22 102/80    Advised to maintain DASH diet, monitor BP at home daily, and send BP readings via mychart on Thursday F/up in 45month

## 2023-04-30 NOTE — Progress Notes (Signed)
Complete physical exam  Patient: Laurie Rangel   DOB: [birth year]   63 y.o. Female  MRN: [id number] Visit Date: 04/30/2023  Subjective:    Chief Complaint  Patient presents with   Annual Exam    Patient sates wanting to have WBC and RBC checked with labs if they aren't in the orders today. Patient does want Flu shot today    Laurie Rangel is a 62 y.o. female who presents today for a complete physical exam. She reports consuming a general diet.  Walking daily  She generally feels well. She reports sleeping well. She does not have additional problems to discuss today.  Vision:Yes Dental:Yes STD Screen:No She plans to schedule appointment with GYN for repeat PAP Due to repeat mammogram in November. Advised to schedule appt BP Readings from Last 3 Encounters:  04/30/23 (!) 140/90  02/01/23 110/80  04/26/22 102/80   Wt Readings from Last 3 Encounters:  04/30/23 167 lb 9.6 oz (76 kg)  02/01/23 161 lb 3.2 oz (73.1 kg)  04/26/22 165 lb 12.8 oz (75.2 kg)   Most recent fall risk assessment:    04/30/2023    8:58 AM  Fall Risk   Falls in the past year? 0  Number falls in past yr: 0  Injury with Fall? 0  Risk for fall due to : No Fall Risks  Follow up Falls evaluation completed   Depression screen:Yes - No Depression Most recent depression screenings:    04/30/2023    8:58 AM 08/29/2022    1:47 PM  PHQ 2/9 Scores  PHQ - 2 Score 0 0    HPI  Hyperlipidemia Repeat lipid panel and CMP today  maintain pravastatin and fenofibrate dose  Elevated BP without diagnosis of hypertension Elevated today BP Readings from Last 3 Encounters:  04/30/23 (!) 140/90  02/01/23 110/80  04/26/22 102/80    Advised to maintain DASH diet, monitor BP at home daily, and send BP readings via mychart on Thursday F/up in 45month  Leukopenia Repeat CBC with diff   Past Medical History:  Diagnosis Date   Allergy    Depression    Hematoma 02/17/2019   Hyperlipidemia    Kidney stone 2011    lithroscopy 2014   Past Surgical History:  Procedure Laterality Date   COLONOSCOPY  01/2010   in [place]   laprascopy  1989   exploratory   lithroscopy  2014   kidney stone   Social History   Socioeconomic History   Marital status: Married    Spouse name: Not on file   Number of children: Not on file   Years of education: Not on file   Highest education level: Not on file  Occupational History   Not on file  Tobacco Use   Smoking status: Never   Smokeless tobacco: Never  Vaping Use   Vaping status: Never Used  Substance and Sexual Activity   Alcohol use: Yes    Comment: social   Drug use: Never   Sexual activity: Not on file  Other Topics Concern   Not on file  Social History Narrative   Not on file   Social Determinants of Health   Financial Resource Strain: Not on file  Food Insecurity: Not on file  Transportation Needs: Not on file  Physical Activity: Not on file  Stress: Not on file  Social Connections: Unknown (12/12/2021)   Received from [facility]   Social Network    Social Network: Not on  file  Intimate Partner Violence: Unknown (11/08/2021)   [facility] from [facility]   HITS    Physically Hurt: Not on file    Insult or Talk Down To: Not on file    Threaten Physical Harm: Not on file    Scream or Curse: Not on file   Family Status  Relation Name Status   Mother  (Not Specified)   Father  (Not Specified)   Neg Hx  (Not Specified)  No partnership data on file   Family History  Problem Relation Age of Onset   Hypertension Mother    Heart disease Mother    Diabetes Father    Colon cancer Neg Hx    Esophageal cancer Neg Hx    Rectal cancer Neg Hx    Stomach cancer Neg Hx    Allergies  Allergen Reactions   Penicillins Hives    Patient Care Team: Keierra Nudo, Bonna Gains, NP as PCP - General (Internal Medicine) Shea Evans, MD as Consulting Physician (Obstetrics and Gynecology)   Medications: Outpatient  Medications Prior to Visit  Medication Sig   ALPRAZolam (XANAX) 0.5 MG tablet 1/2 - 1 tab po daily PRN   atovaquone-proguanil (MALARONE) 250-100 MG TABS tablet Take 1 tablet by mouth daily. Taking only while on trip to [place]   Cholecalciferol (VITAMIN D3 SUPER STRENGTH) 50 MCG (2000 UT) CAPS Take by mouth.   escitalopram (LEXAPRO) 5 MG tablet Take 1 tablet (5 mg total) by mouth daily.   fenofibrate micronized (LOFIBRA) 134 MG capsule Take 1 capsule (134 mg total) by mouth daily before breakfast.   fluticasone (FLONASE) 50 MCG/ACT nasal spray SPRAY 2 SPRAYS INTO EACH NOSTRIL EVERY DAY   Multiple Vitamins-Minerals (CENTRUM SILVER ULTRA WOMENS PO) Take by mouth.   mupirocin ointment (BACTROBAN) 2 % Apply 1 Application topically 2 (two) times daily.   pravastatin (PRAVACHOL) 40 MG tablet Take 1 tablet (40 mg total) by mouth at bedtime.   promethazine (PHENERGAN) 25 MG tablet Take 1 tablet (25 mg total) by mouth daily as needed for nausea or vomiting.   No facility-administered medications prior to visit.    Review of Systems  Constitutional:  Negative for activity change, appetite change and unexpected weight change.  Respiratory: Negative.    Cardiovascular: Negative.   Gastrointestinal: Negative.   Endocrine: Negative for cold intolerance and heat intolerance.  Genitourinary: Negative.   Musculoskeletal: Negative.   Skin: Negative.   Neurological: Negative.   Hematological: Negative.   Psychiatric/Behavioral:  Negative for behavioral problems, decreased concentration, dysphoric mood, hallucinations, self-injury, sleep disturbance and suicidal ideas. The patient is not nervous/anxious.    Last CBC Lab Results  Component Value Date   WBC 3.7 (L) 04/26/2022   HGB 14.5 04/26/2022   HCT 42.9 04/26/2022   MCV 94.7 04/26/2022   RDW 13.1 04/26/2022   PLT 265.0 04/26/2022   Last lipids Lab Results  Component Value Date   CHOL 205 (H) 02/01/2023   HDL 37.60 (L) 02/01/2023   LDLCALC  159 (H) 08/01/2022   LDLDIRECT 124.0 02/01/2023   TRIG 318.0 (H) 02/01/2023   CHOLHDL 5 02/01/2023      Objective:  BP (!) 140/90   Pulse 67   Temp 98.1 F (36.7 C) (Temporal)   Ht 5\' 3"  (1.6 m)   Wt 167 lb 9.6 oz (76 kg)   SpO2 97%   BMI 29.69 kg/m     Physical Exam Vitals and nursing note reviewed.  Constitutional:  General: She is not in acute distress. HENT:     Right Ear: Tympanic membrane, ear canal and external ear normal.     Left Ear: Tympanic membrane, ear canal and external ear normal.     Nose: Nose normal.  Eyes:     Extraocular Movements: Extraocular movements intact.     Conjunctiva/sclera: Conjunctivae normal.     Pupils: Pupils are equal, round, and reactive to light.  Neck:     Thyroid: No thyroid mass, thyromegaly or thyroid tenderness.  Cardiovascular:     Rate and Rhythm: Normal rate and regular rhythm.     Pulses: Normal pulses.     Heart sounds: Normal heart sounds.  Pulmonary:     Effort: Pulmonary effort is normal.     Breath sounds: Normal breath sounds.  Abdominal:     General: Bowel sounds are normal.     Palpations: Abdomen is soft.  Musculoskeletal:        General: Normal range of motion.     Cervical back: Normal range of motion and neck supple.     Right lower leg: No edema.     Left lower leg: No edema.  Lymphadenopathy:     Cervical: No cervical adenopathy.  Skin:    General: Skin is warm and dry.  Neurological:     Mental Status: She is alert and oriented to person, place, and time.     Cranial Nerves: No cranial nerve deficit.  Psychiatric:        Mood and Affect: Mood normal.        Behavior: Behavior normal.        Thought Content: Thought content normal.     No results found for any visits on 04/30/23.    Assessment & Plan:    Routine Health Maintenance and Physical Exam  Immunization History  Administered Date(s) Administered   DTP 08/17/2010   DTaP 03/16/2023   Hep A / Hep B 10/15/2001, 12/02/2001,  04/21/2002   Hepatitis B 03/29/2006   Influenza, Seasonal, Injecte, Preservative Fre 04/30/2023   Influenza,inj,Quad PF,6+ Mos 05/16/2012, 04/26/2022   Influenza-Unspecified 06/17/2020, 05/07/2021   Meningococcal Mcv4o 07/07/2020   Moderna Covid-19 Fall Seasonal Vaccine 45yrs & older 05/20/2022   PFIZER(Purple Top)SARS-COV-2 Vaccination 04/17/2019, 05/08/2019, 11/10/2019, 11/27/2019, 07/14/2020, 01/13/2021   Pneumococcal Polysaccharide-23 01/05/2014   Tdap 07/28/2015   Typhoid Live 12/13/2012   Typhoid Parenteral 08/17/2010   Yellow Fever 12/13/2012   Zoster Recombinant(Shingrix) 06/17/2020, 09/17/2020   Health Maintenance  Topic Date Due   Cervical Cancer Screening (HPV/Pap Cotest)  Never done   MAMMOGRAM  06/28/2024   Colonoscopy  05/22/2029   DTaP/Tdap/Td (4 - Td or Tdap) 03/15/2033   INFLUENZA VACCINE  Completed   COVID-19 Vaccine  Completed   Hepatitis C Screening  Completed   HIV Screening  Completed   Zoster Vaccines- Shingrix  Completed   HPV VACCINES  Aged Out   Discussed health benefits of physical activity, and encouraged her to engage in regular exercise appropriate for her age and condition.  Problem List Items Addressed This Visit     Elevated BP without diagnosis of hypertension    Elevated today BP Readings from Last 3 Encounters:  04/30/23 (!) 140/90  02/01/23 110/80  04/26/22 102/80    Advised to maintain DASH diet, monitor BP at home daily, and send BP readings via mychart on Thursday F/up in 35month      Hyperlipidemia    Repeat lipid panel and CMP today  maintain pravastatin  and fenofibrate dose      Relevant Orders   Lipid panel   TSH   Leukopenia    Repeat CBC with diff      Relevant Orders   CBC with Differential/Platelet   Other Visit Diagnoses     Encounter for preventative adult health care exam with abnormal findings    -  Primary   Relevant Orders   Comprehensive metabolic panel   Immunization due       Relevant Orders   Flu  vaccine trivalent PF, 6mos and older(Flulaval,Afluria,Fluarix,Fluzone) (Completed)      Return in about 4 weeks (around 05/28/2023) for elevated BP.     Alysia Penna, NP

## 2023-05-03 DIAGNOSIS — J322 Chronic ethmoidal sinusitis: Secondary | ICD-10-CM | POA: Diagnosis not present

## 2023-05-03 DIAGNOSIS — J320 Chronic maxillary sinusitis: Secondary | ICD-10-CM | POA: Diagnosis not present

## 2023-05-03 DIAGNOSIS — J0100 Acute maxillary sinusitis, unspecified: Secondary | ICD-10-CM | POA: Diagnosis not present

## 2023-05-03 DIAGNOSIS — J342 Deviated nasal septum: Secondary | ICD-10-CM | POA: Diagnosis not present

## 2023-05-04 ENCOUNTER — Encounter: Payer: Self-pay | Admitting: Nurse Practitioner

## 2023-05-08 DIAGNOSIS — J343 Hypertrophy of nasal turbinates: Secondary | ICD-10-CM | POA: Insufficient documentation

## 2023-05-16 ENCOUNTER — Other Ambulatory Visit: Payer: Self-pay | Admitting: Nurse Practitioner

## 2023-05-16 DIAGNOSIS — E782 Mixed hyperlipidemia: Secondary | ICD-10-CM

## 2023-05-16 NOTE — Telephone Encounter (Signed)
Chart supports rx. Last OV: 04/30/2023

## 2023-05-31 ENCOUNTER — Encounter: Payer: Self-pay | Admitting: Nurse Practitioner

## 2023-06-08 DIAGNOSIS — J342 Deviated nasal septum: Secondary | ICD-10-CM | POA: Diagnosis not present

## 2023-06-08 DIAGNOSIS — J0100 Acute maxillary sinusitis, unspecified: Secondary | ICD-10-CM | POA: Diagnosis not present

## 2023-06-08 DIAGNOSIS — Z01810 Encounter for preprocedural cardiovascular examination: Secondary | ICD-10-CM | POA: Diagnosis not present

## 2023-06-08 DIAGNOSIS — J322 Chronic ethmoidal sinusitis: Secondary | ICD-10-CM | POA: Diagnosis not present

## 2023-06-08 DIAGNOSIS — J320 Chronic maxillary sinusitis: Secondary | ICD-10-CM | POA: Diagnosis not present

## 2023-06-08 DIAGNOSIS — J343 Hypertrophy of nasal turbinates: Secondary | ICD-10-CM | POA: Diagnosis not present

## 2023-06-08 DIAGNOSIS — J329 Chronic sinusitis, unspecified: Secondary | ICD-10-CM | POA: Diagnosis not present

## 2023-06-08 DIAGNOSIS — Z880 Allergy status to penicillin: Secondary | ICD-10-CM | POA: Diagnosis not present

## 2023-06-13 DIAGNOSIS — J322 Chronic ethmoidal sinusitis: Secondary | ICD-10-CM | POA: Diagnosis not present

## 2023-07-02 ENCOUNTER — Ambulatory Visit (HOSPITAL_BASED_OUTPATIENT_CLINIC_OR_DEPARTMENT_OTHER)
Admission: RE | Admit: 2023-07-02 | Discharge: 2023-07-02 | Disposition: A | Discharge status: Home / Self Care | Payer: BC Managed Care – PPO | Source: Ambulatory Visit | Attending: Family Medicine | Admitting: Family Medicine

## 2023-07-02 ENCOUNTER — Encounter: Payer: Self-pay | Admitting: Family Medicine

## 2023-07-02 ENCOUNTER — Ambulatory Visit (INDEPENDENT_AMBULATORY_CARE_PROVIDER_SITE_OTHER): Payer: BC Managed Care – PPO | Admitting: Family Medicine

## 2023-07-02 VITALS — BP 128/74 | Ht 63.0 in | Wt 167.0 lb

## 2023-07-02 DIAGNOSIS — G8929 Other chronic pain: Secondary | ICD-10-CM | POA: Diagnosis not present

## 2023-07-02 DIAGNOSIS — M25511 Pain in right shoulder: Secondary | ICD-10-CM | POA: Diagnosis not present

## 2023-07-02 DIAGNOSIS — J322 Chronic ethmoidal sinusitis: Secondary | ICD-10-CM | POA: Diagnosis not present

## 2023-07-02 DIAGNOSIS — J320 Chronic maxillary sinusitis: Secondary | ICD-10-CM | POA: Diagnosis not present

## 2023-07-02 NOTE — Progress Notes (Signed)
CHIEF COMPLAINT: No chief complaint on file.  _____________________________________________________________ SUBJECTIVE  HPI  Pt is a 62 y.o. female here for evaluation of R shoulder/arm pain x 5 months  Is RHD. Was playing golf at that time but no noted/identified injuries/falls.  Location: sometimes on the side/front, sometimes in the back, and will feel it in the muscle down her lateral humerus, sometimes shoots down to her hand.  Described as an intermittent ache, lasting few seconds at a time Had numbness in her hand when she woke up once, can't recall if she had shoulder pain at that time.  Has not had this pain before Does note hx joint aches when on a particular statin medication; PCP had changed Rx and this seemed to help. She's uncertain if muscles aches are connected to medication side effect MHX includes prediabetes (04/2022 hgba1c 5.9), HLD (last panel 01/2023) 01/2022 was evaluated for a soft tissue mass on the R arm following a fall a/w pain, had an MRI completed for concerns for a soft tissue mass; no soft tissue mass had been identified on imaging at that time  States she feels something like a mass in the same place, will have pain in that area/pain seems to be connected to the location Last encounter in clinic 02/2019 (acute R knee pain, dx MCL sprain) ------------------------------------------------------------------------------------------------------ Past Medical History:  Diagnosis Date   Allergy    Depression    Hematoma 02/17/2019   Hyperlipidemia    Kidney stone 2011   lithroscopy 2014    Past Surgical History:  Procedure Laterality Date   COLONOSCOPY  01/2010   in [place]   laprascopy  1989   exploratory   lithroscopy  2014   kidney stone      No outpatient medications have been marked as taking for the 07/02/23 encounter (Office Visit) with Burna Forts, MD.     ------------------------------------------------------------------------------------------------------  _____________________________________________________________ OBJECTIVE  PHYSICAL EXAM  Today's Vitals   07/02/23 0809  BP: 128/74  Weight: 167 lb (75.8 kg)  Height: 5\' 3"  (1.6 m)   Body mass index is 29.58 kg/m.   reviewed  General: A+Ox3, no acute distress, well-nourished, appropriate affect CV: pulses 2+ regular, nondiaphoretic, no peripheral edema, cap refill <2sec Lungs: no audible wheezing, non-labored breathing, bilateral chest rise/fall, nontachypneic Skin: warm, well-perfused, non-icteric, no susp lesions or rashes Neuro: no focal deficits. Sensation intact, muscle tone wnl, no atrophy Psych: no signs of depression or anxiety MSK:     R Shoulder:  No deformity, swelling or muscle wasting Asymmetric scapular mobility No scapular winging FF 180, abd 180, int 0, ext 90, endorses stiffness end flexion and abduction, symmetric shoulder extension. Symmetric ER NTTP over the [place], clavicle, ac, coracoid, biceps groove, humerus, deltoid, subacromial space, scap spine, musculature, trap, cervical spine Non-reproducible TTP R lateral pectoral, deltoid, tricep, and upper thoracic musculature. Trigger points not identified Neg neer, hawkins, empty can, scarf test, hornblower, resisted anterior flexion, subscap liftoff, speeds, obriens (maneuver provocative of non-reproducible sharp muscular pain), yergason Strength bilaterally intact on testing Neg apprehension Negative Spurling's test bilat Symmetric reduced ROM lateral neck rotation No soft tissue lesions identified _____________________________________________________________ ASSESSMENT/PLAN Diagnoses and all orders for this visit:  Chronic right shoulder pain -     DG Shoulder Right; Future -     Ambulatory referral to Physical Therapy   Unclear etiology, intermittent/occasional, non-reproducible muscular pain of  the upper back/ upper arm/ pectoral musculature. Reassuring exam, low suspicion for RTC/labral tear. Differential includes undiagnosed OA, muscle  overuse/injury, medication side effect. Discussed options for management - XR R shoulder, humerus - PT referral - may consider heat therapy/ice as needed, topical treatments (biofreeze, tigerbalm/icy hot) as needed - anticipate f/u in 6-8 weeks PRN. If pain worsening/not improving may consider diagnostic US for further evaluation All questions answered, patient verbalized understanding and in agreement with plan  Electronically signed by: Burna Forts, MD 07/02/2023 8:40 AM

## 2023-07-04 ENCOUNTER — Other Ambulatory Visit: Payer: Self-pay | Admitting: Nurse Practitioner

## 2023-07-04 DIAGNOSIS — G43009 Migraine without aura, not intractable, without status migrainosus: Secondary | ICD-10-CM

## 2023-07-10 NOTE — Therapy (Incomplete)
OUTPATIENT PHYSICAL THERAPY SHOULDER EVALUATION   Patient Name: Laurie Rangel MRN: [id number] DOB:[birth year], 62 y.o., female Today's Date: 07/10/2023   END OF SESSION:   Past Medical History:  Diagnosis Date   Allergy    Depression    Hematoma 02/17/2019   Hyperlipidemia    Kidney stone 2011   lithroscopy 2014   Past Surgical History:  Procedure Laterality Date   COLONOSCOPY  01/2010   in [place]   laprascopy  1989   exploratory   lithroscopy  2014   kidney stone   Patient Active Problem List   Diagnosis Date Noted   Elevated BP without diagnosis of hypertension 04/30/2023   Leukopenia 04/30/2023   Migraine without aura and without status migrainosus, not intractable 04/17/2023   Cataracts, bilateral 05/15/2022   Memory deficit 04/26/2022   Anxiety 10/24/2021   Hyperlipidemia 10/24/2021   Hx of herpes zoster 10/24/2021   Sprain of medial collateral ligament of right knee 02/17/2019   Allergic rhinitis 02/01/2014   Eustachian tube dysfunction 02/01/2014   Nephrolithiasis 2011    PCP: Anne Ng, NP   REFERRING PROVIDER: Burna Forts, MD   REFERRING DIAG: M25.511,G89.29 (ICD-10-CM) - Chronic right shoulder pain   THERAPY DIAG:  No diagnosis found.  RATIONALE FOR EVALUATION AND TREATMENT: Rehabilitation  ONSET DATE: ***  NEXT MD VISIT: 09/04/23   SUBJECTIVE:                                                                                                                                                                                                         SUBJECTIVE STATEMENT: ***  Per MD:  6 months of shoulder/upper back pain, tenderness along shoulder blade with some asymmetric scapular motion, occasional non-reproducible pain with shoulder abduction, resisted shoulder flexion, soft tissue tenderness along pectoral and deltoid muscle distribution; evaluation/treat. Pt may be interested in HEP if deemed appropriate.  PAIN: Are you having  pain? {OPRCPAIN:27236}  PERTINENT HISTORY:  ***elevated BP, leukopenia, migraine, anxiety, depression, memory deficit  PRECAUTIONS: {Therapy precautions:24002}  RED FLAGS: {PT Red Flags:29287}  HAND DOMINANCE: {Hand Dominance:29389}  WEIGHT BEARING RESTRICTIONS: {Yes ***/No:24003}  FALLS:  Has patient fallen in last 6 months? {fallsyesno:27318}  LIVING ENVIRONMENT: Lives with: {OPRC lives with:25569::"lives with their family"} Lives in: {Lives in:25570} Stairs: {opstairs:27293} Has following equipment at home: {Assistive devices:23999}  OCCUPATION: ***  PLOF: {PLOF:24004}  PATIENT GOALS: ***   OBJECTIVE: (objective measures completed at initial evaluation unless otherwise dated)  DIAGNOSTIC FINDINGS:  ***  PATIENT SURVEYS:  Quick Dash ***  COGNITION: Overall cognitive status: {  cognition:24006}     SENSATION: {sensation:27233}  POSTURE: {posture:25561}  UPPER EXTREMITY ROM:   {AROM/PROM:27142} ROM Right eval Left eval  Shoulder flexion    Shoulder extension    Shoulder abduction    Shoulder adduction    Shoulder internal rotation    Shoulder external rotation    Elbow flexion    Elbow extension    Wrist flexion    Wrist extension    Wrist ulnar deviation    Wrist radial deviation    Wrist pronation    Wrist supination    (Blank rows = not tested)  UPPER EXTREMITY MMT:  MMT Right eval Left eval  Shoulder flexion    Shoulder extension    Shoulder abduction    Shoulder adduction    Shoulder internal rotation    Shoulder external rotation    Middle trapezius    Lower trapezius    Elbow flexion    Elbow extension    Wrist flexion    Wrist extension    Wrist ulnar deviation    Wrist radial deviation    Wrist pronation    Wrist supination    Grip strength (lbs)    (Blank rows = not tested)  SHOULDER SPECIAL TESTS: Impingement tests: {shoulder impingement test:25231:a} SLAP lesions: {SLAP lesions:25232} Instability tests: {shoulder  instability test:25233} Rotator cuff assessment: {rotator cuff assessment:25234} Biceps assessment: {biceps assessment:25235}  JOINT MOBILITY TESTING:  ***  PALPATION:  ***    TODAY'S TREATMENT:  ***   PATIENT EDUCATION:  Education details: {Education details:27468}  Person educated: {Person educated:25204} Education method: {Education Method:25205} Education comprehension: {Education Comprehension:25206}  HOME EXERCISE PROGRAM: ***   ASSESSMENT:  CLINICAL IMPRESSION: Laurie Rangel is a 62 y.o. female who was referred to physical therapy for evaluation and treatment for R shoulder and upper back pain originating ~6 months ago ***.    OBJECTIVE IMPAIRMENTS: {opptimpairments:25111}.   ACTIVITY LIMITATIONS: {activitylimitations:27494}  PARTICIPATION LIMITATIONS: {participationrestrictions:25113}  PERSONAL FACTORS: {Personal factors:25162} are also affecting patient's functional outcome.   REHAB POTENTIAL: {rehabpotential:25112}  CLINICAL DECISION MAKING: {clinical decision making:25114}  EVALUATION COMPLEXITY: {Evaluation complexity:25115}   GOALS: Goals reviewed with patient? {yes/no:20286}  SHORT TERM GOALS: Target date: ***  Patient will be independent with initial HEP to improve outcomes and carryover.  Baseline: *** Goal status: {GOALSTATUS:25110}  2.  ***  Baseline: *** Goal status: {GOALSTATUS:25110}  3.  *** Baseline: *** Goal status: {GOALSTATUS:25110}  LONG TERM GOALS: Target date: ***  Patient will be independent with ongoing/advanced HEP for self-management at home.  Baseline: *** Goal status: {GOALSTATUS:25110}  2.  Patient will report 75% improvement in *** shoulder pain to improve QOL.  Baseline: *** Goal status: {GOALSTATUS:25110}  3.  Patient to demonstrate improved upright posture with posterior shoulder girdle engaged to promote improved glenohumeral joint mobility. Baseline: *** Goal status: {GOALSTATUS:25110}  4.  Patient  to improve *** shoulder AROM to {Functional status:27472} without pain provocation to allow for increased ease of ADLs.  Baseline: *** Goal status: {GOALSTATUS:25110}  5.  Patient will demonstrate improved *** strength to >/= ***/5 for functional UE use. Baseline: *** Goal status: {GOALSTATUS:25110}  6  Patient will report </= ***% (10 pts) on QuickDASH to demonstrate improved functional ability.  Baseline: *** Goal status: {GOALSTATUS:25110}  7.  Patient will ***   Baseline: *** Goal status: {GOALSTATUS:25110}   8. *** Baseline: *** Goal status: {GOALSTATUS:25110}   PLAN:  PT FREQUENCY: {rehab frequency:25116}  PT DURATION: {rehab duration:25117}  PLANNED INTERVENTIONS: {rehab planned interventions:25118::"97110-Therapeutic exercises","97530- Therapeutic [phone]-  Neuromuscular re-education","97535- Self WNUU","72536- Manual therapy"}  PLAN FOR NEXT SESSION: ***   Marry Guan, PT 07/10/2023, 8:33 AM

## 2023-07-11 ENCOUNTER — Other Ambulatory Visit: Payer: Self-pay | Admitting: Nurse Practitioner

## 2023-07-11 DIAGNOSIS — E781 Pure hyperglyceridemia: Secondary | ICD-10-CM

## 2023-07-18 ENCOUNTER — Ambulatory Visit: Payer: BC Managed Care – PPO | Attending: Family Medicine | Admitting: Physical Therapy

## 2023-08-16 DIAGNOSIS — Z1231 Encounter for screening mammogram for malignant neoplasm of breast: Secondary | ICD-10-CM | POA: Diagnosis not present

## 2023-08-16 DIAGNOSIS — Z1331 Encounter for screening for depression: Secondary | ICD-10-CM | POA: Diagnosis not present

## 2023-08-16 DIAGNOSIS — Z01419 Encounter for gynecological examination (general) (routine) without abnormal findings: Secondary | ICD-10-CM | POA: Diagnosis not present

## 2023-08-16 LAB — HM MAMMOGRAPHY

## 2023-09-04 ENCOUNTER — Ambulatory Visit (INDEPENDENT_AMBULATORY_CARE_PROVIDER_SITE_OTHER): Payer: BC Managed Care – PPO | Admitting: Family Medicine

## 2023-09-04 ENCOUNTER — Encounter: Payer: Self-pay | Admitting: Family Medicine

## 2023-09-04 VITALS — BP 120/82 | Ht 63.0 in | Wt 167.0 lb

## 2023-09-04 DIAGNOSIS — M19011 Primary osteoarthritis, right shoulder: Secondary | ICD-10-CM | POA: Diagnosis not present

## 2023-09-04 DIAGNOSIS — M67911 Unspecified disorder of synovium and tendon, right shoulder: Secondary | ICD-10-CM

## 2023-09-04 NOTE — Progress Notes (Signed)
CHIEF COMPLAINT: No chief complaint on file.  _____________________________________________________________ SUBJECTIVE  HPI  Pt is a 63 y.o. female here for Follow-up of right shoulder pain  Last seen 07/02/2023, had been ongoing for 5 months at that time Is RHD. Was playing golf at that time but no noted/identified injuries/falls.  Location: sometimes on the side/front, sometimes in the back, and will feel it in the muscle down her lateral humerus, sometimes shoots down to her hand.  Described as an intermittent ache, lasting few seconds at a time Had numbness in her hand when she woke up once, can't recall if she had shoulder pain at that time.  Has not had this pain before Does note hx joint aches when on a particular statin medication; PCP had changed Rx and this seemed to help. She's uncertain if muscles aches are connected to medication side effect MHX includes prediabetes (04/2022 hgba1c 5.9), HLD (last panel 01/2023)  PT referral placed at that time, anticipating 6-8 weeks follow-up Right shoulder independently reviewed GHJ spacing preserved, signs of ACJ decreased joint spacing, trace degenerative changes  Primarily located  in the back of her arm, can radiate down to her hand sometimes Numbness/tingling: maybe 2 weeks ago, does not think she was doing a specific activity Exacerbated by: nighttime  Right upper arm in location Therapies tried so far: advil (helps with sleep) Works in Audiological scientist, in front of a Scientist, physiological. Golfs Will ache on the right side of her arm, down to her hand.  Did not go to physical therapy, was sick the first visit. Did not schedule a follow-up.   ------------------------------------------------------------------------------------------------------ Past Medical History:  Diagnosis Date   Allergy    Depression    Hematoma 02/17/2019   Hyperlipidemia    Kidney stone 2011   lithroscopy 2014    Past Surgical History:  Procedure  Laterality Date   COLONOSCOPY  01/2010   in [place]   laprascopy  1989   exploratory   lithroscopy  2014   kidney stone      Outpatient Encounter Medications as of 09/04/2023  Medication Sig   ALPRAZolam (XANAX) 0.5 MG tablet 1/2 - 1 tab po daily PRN   atovaquone-proguanil (MALARONE) 250-100 MG TABS tablet Take 1 tablet by mouth daily. Taking only while on trip to [place]   Cholecalciferol (VITAMIN D3 SUPER STRENGTH) 50 MCG (2000 UT) CAPS Take by mouth.   escitalopram (LEXAPRO) 5 MG tablet Take 1 tablet (5 mg total) by mouth daily.   fenofibrate micronized (LOFIBRA) 134 MG capsule TAKE 1 CAPSULE BY MOUTH DAILY BEFORE BREAKFAST.   fluticasone (FLONASE) 50 MCG/ACT nasal spray SPRAY 2 SPRAYS INTO EACH NOSTRIL EVERY DAY   Multiple Vitamins-Minerals (CENTRUM SILVER ULTRA WOMENS PO) Take by mouth.   mupirocin ointment (BACTROBAN) 2 % Apply 1 Application topically 2 (two) times daily.   pravastatin (PRAVACHOL) 40 MG tablet TAKE 1 TABLET BY MOUTH EVERYDAY AT BEDTIME   promethazine (PHENERGAN) 25 MG tablet TAKE 1 TABLET (25 MG TOTAL) BY MOUTH DAILY AS NEEDED FOR NAUSEA OR VOMITING.   [DISCONTINUED] zolpidem (AMBIEN) 5 MG tablet Take 1 tablet (5 mg total) by mouth at bedtime as needed for sleep. (Patient not taking: Reported on 05/16/2019)   No facility-administered encounter medications on file as of 09/04/2023.    ------------------------------------------------------------------------------------------------------  _____________________________________________________________ OBJECTIVE  PHYSICAL EXAM  Today's Vitals   09/04/23 0805  BP: 120/82  Weight: 167 lb (75.8 kg)  Height: 5\' 3"  (1.6 m)   Body mass index is 29.58  kg/m.   reviewed  General: A+Ox3, no acute distress, well-nourished, appropriate affect CV: pulses 2+ regular, nondiaphoretic, no peripheral edema, cap refill <2sec Lungs: no audible wheezing, non-labored breathing, bilateral chest rise/fall, nontachypneic Skin:  warm, well-perfused, non-icteric, no susp lesions or rashes Neuro:  Sensation intact, muscle tone wnl, no atrophy Psych: no signs of depression or anxiety MSK:   R Shoulder:  No deformity, swelling or muscle wasting FF 180, abd 180, symmetric ER Some TTP over triceps musculature NTTP over the [place], clavicle, ac, coracoid, biceps groove, humerus, deltoid, subacromial space, scap spine, musculature, trap, cervical spine Neer+, neg hawkins, yergason negative, Emptycan +, scarf neg, hornblower with weakness, subscap belly press negative. - speeds, -obriens Negative Spurling's test Full forward flexion neck, mild limitations with full extension and bilateral rotation  Limited POCUS demonstrating signs of biceps tendinosis, subscap tendinosis _____________________________________________________________ ASSESSMENT/PLAN Diagnoses and all orders for this visit:  Dysfunction of right rotator cuff  Arthritis of right acromioclavicular joint   XR and POCUS findings reviewed with patient.  Suspect some degree of rotator cuff dysfunction/impingement contributing to pain, possible concomitant arthritic/labral irritation at night.  Patient reflects that her positioning at work may potentially be putting pressure on her shoulder joint, which may certainly be contributing to her current symptoms.  Discussed options for management, declines subacromial bursa injection as her pain has not been particularly significant.  She plans to reschedule her first PT visit, likely to benefit and do well with a home exercise program.  Discussed other OTC therapies, may continue with Advil at night or Tylenol.  May also trial heat therapy, Biofreeze/Tiger balm.  Anticipate follow-up PRN especially if she is interested in a trial CSI to help with ongoing symptoms. All questions answered. Return precautions discussed. Patient verbalized understanding and is in agreement with plan  Electronically signed by: Burna Forts, MD  09/04/2023 10:16 AM

## 2023-10-05 ENCOUNTER — Other Ambulatory Visit: Payer: Self-pay | Admitting: Nurse Practitioner

## 2023-10-05 DIAGNOSIS — J0120 Acute ethmoidal sinusitis, unspecified: Secondary | ICD-10-CM

## 2023-11-12 DIAGNOSIS — J322 Chronic ethmoidal sinusitis: Secondary | ICD-10-CM | POA: Diagnosis not present

## 2023-11-12 DIAGNOSIS — Z9109 Other allergy status, other than to drugs and biological substances: Secondary | ICD-10-CM | POA: Insufficient documentation

## 2023-11-12 DIAGNOSIS — J320 Chronic maxillary sinusitis: Secondary | ICD-10-CM | POA: Diagnosis not present

## 2024-01-18 ENCOUNTER — Other Ambulatory Visit: Payer: Self-pay | Admitting: Nurse Practitioner

## 2024-01-18 DIAGNOSIS — E781 Pure hyperglyceridemia: Secondary | ICD-10-CM

## 2024-01-18 NOTE — Telephone Encounter (Signed)
 Called patient and explained why I was calling. She stated that we can scheduled the appointment. Patient is now scheduled for an appointment for 03/06/24 with [facility].

## 2024-01-18 NOTE — Telephone Encounter (Signed)
 Medication: Fenofibrate  Micronized Orelia Binet) 134 mg Directions: Take 1 capsule by mouth daily with breakfast Last given: 07/11/23 Number refills: 1 Last o/v: 04/30/23 Follow up: 4 week f/u (around 05/28/23) Labs: 04/30/23

## 2024-03-06 ENCOUNTER — Encounter: Payer: Self-pay | Admitting: Nurse Practitioner

## 2024-03-06 ENCOUNTER — Ambulatory Visit (INDEPENDENT_AMBULATORY_CARE_PROVIDER_SITE_OTHER): Payer: Self-pay | Admitting: Nurse Practitioner

## 2024-03-06 VITALS — BP 138/80 | HR 69 | Temp 98.4°F | Ht 63.0 in | Wt 171.0 lb

## 2024-03-06 DIAGNOSIS — E66811 Obesity, class 1: Secondary | ICD-10-CM

## 2024-03-06 DIAGNOSIS — Z6830 Body mass index (BMI) 30.0-30.9, adult: Secondary | ICD-10-CM

## 2024-03-06 DIAGNOSIS — E782 Mixed hyperlipidemia: Secondary | ICD-10-CM

## 2024-03-06 DIAGNOSIS — R030 Elevated blood-pressure reading, without diagnosis of hypertension: Secondary | ICD-10-CM | POA: Diagnosis not present

## 2024-03-06 DIAGNOSIS — E6609 Other obesity due to excess calories: Secondary | ICD-10-CM

## 2024-03-06 DIAGNOSIS — F411 Generalized anxiety disorder: Secondary | ICD-10-CM

## 2024-03-06 LAB — LIPID PANEL
Cholesterol: 200 mg/dL (ref 0–200)
HDL: 49.5 mg/dL (ref >39.00)
LDL Cholesterol: 126 mg/dL — ABNORMAL HIGH (ref 0–99)
NonHDL: 150.1
Total CHOL/HDL Ratio: 4
Triglycerides: 122 mg/dL (ref 0.0–149.0)
VLDL: 24.4 mg/dL (ref 0.0–40.0)

## 2024-03-06 LAB — COMPREHENSIVE METABOLIC PANEL WITH GFR
ALT: 28 U/L (ref 0–35)
AST: 28 U/L (ref 0–37)
Albumin: 4.5 g/dL (ref 3.5–5.2)
Alkaline Phosphatase: 72 U/L (ref 39–117)
BUN: 16 mg/dL (ref 6–23)
CO2: 26 meq/L (ref 19–32)
Calcium: 9.4 mg/dL (ref 8.4–10.5)
Chloride: 106 meq/L (ref 96–112)
Creatinine, Ser: 0.78 mg/dL (ref 0.40–1.20)
GFR: 80.27 mL/min (ref >60.00)
Glucose, Bld: 101 mg/dL — ABNORMAL HIGH (ref 70–99)
Potassium: 4 meq/L (ref 3.5–5.1)
Sodium: 142 meq/L (ref 135–145)
Total Bilirubin: 0.6 mg/dL (ref 0.2–1.2)
Total Protein: 7.1 g/dL (ref 6.0–8.3)

## 2024-03-06 LAB — CK: Total CK: 263 U/L — ABNORMAL HIGH (ref 17–177)

## 2024-03-06 MED ORDER — ESCITALOPRAM OXALATE 5 MG PO TABS
5.0000 mg | ORAL_TABLET | Freq: Every day | ORAL | 3 refills | Status: AC
Start: 2024-03-06 — End: ?

## 2024-03-06 MED ORDER — ALPRAZOLAM 0.5 MG PO TABS: ORAL_TABLET | ORAL | 0 refills | Status: AC

## 2024-03-06 NOTE — Patient Instructions (Signed)
 Go to lab Maintain Heart healthy diet and daily exercise. Maintain current medications.  Mediterranean Diet A Mediterranean diet is based on the traditions of countries on the [employer]. It focuses on eating more: Fruits and vegetables. Whole grains, beans, nuts, and seeds. Heart-healthy fats. These are fats that are good for your heart. It involves eating less: Dairy. Meat and eggs. Processed foods with added sugar, salt, and fat. This type of diet can help prevent certain conditions. It can also improve outcomes if you have a long-term (chronic) disease, such as kidney or heart disease. What are tips for following this plan? Reading food labels Check packaged foods for: The serving size. For foods such as rice and pasta, the serving size is the amount of cooked product, not dry. The total fat. Avoid foods with saturated fat or trans fat. Added sugars, such as corn syrup. Shopping  Try to have a balanced diet. Buy a variety of foods, such as: Fresh fruits and vegetables. You may be able to get these from local farmers markets. You can also buy them frozen. Grains, beans, nuts, and seeds. Some of these can be bought in bulk. Fresh seafood. Poultry and eggs. Low-fat dairy products. Buy whole ingredients instead of foods that have already been packaged. If you can't get fresh seafood, buy precooked frozen shrimp or canned fish, such as tuna, salmon, or sardines. Stock your pantry so you always have certain foods on hand, such as olive oil, canned tuna, canned tomatoes, rice, pasta, and beans. Cooking Cook foods with extra-virgin olive oil instead of using butter or other vegetable oils. Have meat as a side dish. Have vegetables or grains as your main dish. This means having meat in small portions or adding small amounts of meat to foods like pasta or stew. Use beans or vegetables instead of meat in common dishes like chili or lasagna. Try out different cooking methods. Try  roasting, broiling, steaming, and sauting vegetables. Add frozen vegetables to soups, stews, pasta, or rice. Add nuts or seeds for added healthy fats and plant protein at each meal. You can add these to yogurt, salads, or vegetable dishes. Marinate fish or vegetables using olive oil, lemon juice, garlic, and fresh herbs. Meal planning Plan to eat a vegetarian meal one day each week. Try to work up to two vegetarian meals, if possible. Eat seafood two or more times a week. Have healthy snacks on hand. These may include: Vegetable sticks with hummus. Greek yogurt. Fruit and nut trail mix. Eat balanced meals. These should include: Fruit: 2-3 servings a day. Vegetables: 4-5 servings a day. Low-fat dairy: 2 servings a day. Fish, poultry, or lean meat: 1 serving a day. Beans and legumes: 2 or more servings a week. Nuts and seeds: 1-2 servings a day. Whole grains: 6-8 servings a day. Extra-virgin olive oil: 3-4 servings a day. Limit red meat and sweets to just a few servings a month. Lifestyle  Try to cook and eat meals with your family. Drink enough fluid to keep your pee (urine) pale yellow. Be active every day. This includes: Aerobic exercise, which is exercise that causes your heart to beat faster. Examples include running and swimming. Leisure activities like gardening, walking, or housework. Get 7-8 hours of sleep each night. Drink red wine if your provider says you can. A glass of wine is 5 oz (150 mL). You may be allowed to have: Up to 1 glass a day if you're female and not pregnant. Up to 2 glasses  a day if you're female. What foods should I eat? Fruits Apples. Apricots. Avocado. Berries. Bananas. Cherries. Dates. Figs. Grapes. Lemons. Melon. Oranges. Peaches. Plums. Pomegranate. Vegetables Artichokes. Beets. Broccoli. Cabbage. Carrots. Eggplant. Green beans. Chard. Kale. Spinach. Onions. Leeks. Peas. Squash. Tomatoes. Peppers. Radishes. Grains Whole-grain pasta. Brown rice.  Bulgur wheat. Polenta. Couscous. Whole-wheat bread. Orpah Cobb. Meats and other proteins Beans. Almonds. Sunflower seeds. Pine nuts. Peanuts. Cod. Salmon. Scallops. Shrimp. Tuna. Tilapia. Clams. Oysters. Eggs. Chicken or [place] without skin. Dairy Low-fat milk. Cheese. Greek yogurt. Fats and oils Extra-virgin olive oil. Avocado oil. Grapeseed oil. Beverages Water. Red wine. Herbal tea. Sweets and desserts Greek yogurt with honey. Baked apples. Poached pears. Trail mix. Seasonings and condiments Basil. Cilantro. Coriander. Cumin. Mint. Parsley. Sage. Rosemary. Tarragon. Garlic. Oregano. Thyme. Pepper. Balsamic vinegar. Tahini. Hummus. Tomato sauce. Olives. Mushrooms. The items listed above may not be all the foods and drinks you can have. Talk to a dietitian to learn more. What foods should I limit? This is a list of foods that should be eaten rarely. Fruits Fruit canned in syrup. Vegetables Deep-fried potatoes, like [place] fries. Grains Packaged pasta or rice dishes. Cereal with added sugar. Snacks with added sugar. Meats and other proteins Beef. Pork. Lamb. Chicken or [place] with skin. Hot dogs. Tomasa Blase. Dairy Ice cream. Sour cream. Whole milk. Fats and oils Butter. Canola oil. Vegetable oil. Beef fat (tallow). Lard. Beverages Juice. Sugar-sweetened soft drinks. Beer. Liquor and spirits. Sweets and desserts Cookies. Cakes. Pies. Candy. Seasonings and condiments Mayonnaise. Pre-made sauces and marinades. The items listed above may not be all the foods and drinks you should limit. Talk to a dietitian to learn more. Where to find more information American Heart Association (AHA): heart.org This information is not intended to replace advice given to you by your health care provider. Make sure you discuss any questions you have with your health care provider. Document Revised: 11/05/2022 Document Reviewed: 11/05/2022 Elsevier Patient Education  2024 [employer].

## 2024-03-06 NOTE — Assessment & Plan Note (Signed)
 Reports myalgia with pravastatin  and crestor. Advised to maintain a mediterranean diet and avoid ALCOHOL consumption Stop pravastatin  Repeat lipid panel, CK and CMP today  Consider referral to lipid clinic if abnormal lipid panel

## 2024-03-06 NOTE — Assessment & Plan Note (Signed)
 Home BP 110s-130s/70s-80s BP Readings from Last 3 Encounters:  03/06/24 138/80  09/04/23 120/82  07/02/23 128/74    Maintain DASH diet and daily exercise

## 2024-03-06 NOTE — Progress Notes (Signed)
 Established Patient Visit  Patient: Laurie Rangel   DOB: August 13, 1960   63 y.o. Female  MRN: [id number] Visit Date: 03/06/2024  Subjective:    Chief Complaint  Patient presents with   Follow-up    4 week follow up  Refills for Xanax  and Lexapro  needed    HPI GAD (generalized anxiety disorder) Stable with lexapro  5mg  daily Use of alprazolam  0.25mg  sparingly, last filled 2023.  Med refill sent  Hyperlipidemia Reports myalgia with pravastatin  and crestor. Advised to maintain a mediterranean diet and avoid ALCOHOL consumption Stop pravastatin  Repeat lipid panel, CK and CMP today  Consider referral to lipid clinic if abnormal lipid panel  Class 1 obesity due to excess calories with serious comorbidity and body mass index (BMI) of 30.0 to 30.9 in adult 1-2glasses of wine daily 2cups of coffee daily Occassional unsweet tea or diet soda daily Exercise: walking daily Meals: restaurant 2-3x/week. Snacks at night: popcorn, cheese and crackers, ice cream Wt Readings from Last 3 Encounters:  03/06/24 171 lb (77.6 kg)  09/04/23 167 lb (75.8 kg)  07/02/23 167 lb (75.8 kg)    Advised about need for diet modification and weight training exercise Provided printed information F/up in 3-87months  Elevated BP without diagnosis of hypertension Home BP 110s-130s/70s-80s BP Readings from Last 3 Encounters:  03/06/24 138/80  09/04/23 120/82  07/02/23 128/74    Maintain DASH diet and daily exercise    Reviewed medical, surgical, and social history today  Medications: Outpatient Medications Prior to Visit  Medication Sig   Cholecalciferol (VITAMIN D3 SUPER STRENGTH) 50 MCG (2000 UT) CAPS Take by mouth.   fenofibrate  micronized (LOFIBRA) 134 MG capsule Take 1 capsule (134 mg total) by mouth daily before breakfast.   Multiple Vitamins-Minerals (CENTRUM SILVER ULTRA WOMENS PO) Take by mouth.   mupirocin  ointment (BACTROBAN ) 2 % Apply 1 Application topically 2 (two) times daily.    promethazine  (PHENERGAN ) 25 MG tablet TAKE 1 TABLET (25 MG TOTAL) BY MOUTH DAILY AS NEEDED FOR NAUSEA OR VOMITING.   [DISCONTINUED] ALPRAZolam  (XANAX ) 0.5 MG tablet 1/2 - 1 tab po daily PRN   [DISCONTINUED] escitalopram  (LEXAPRO ) 5 MG tablet Take 1 tablet (5 mg total) by mouth daily.   [DISCONTINUED] pravastatin  (PRAVACHOL ) 40 MG tablet TAKE 1 TABLET BY MOUTH EVERYDAY AT BEDTIME   [DISCONTINUED] atovaquone -proguanil (MALARONE ) 250-100 MG TABS tablet Take 1 tablet by mouth daily. Taking only while on trip to [place] (Patient not taking: Reported on 03/06/2024)   [DISCONTINUED] fluticasone  (FLONASE ) 50 MCG/ACT nasal spray SPRAY 2 SPRAYS INTO EACH NOSTRIL EVERY DAY (Patient not taking: Reported on 03/06/2024)   No facility-administered medications prior to visit.   Reviewed past medical and social history.   ROS per HPI above      Objective:  BP 138/80 (BP Location: Left Arm, Patient Position: Sitting, Cuff Size: Normal)   Pulse 69   Temp 98.4 F (36.9 C) (Oral)   Ht 5' 3 (1.6 m)   Wt 171 lb (77.6 kg)   SpO2 94%   BMI 30.29 kg/m      Physical Exam Vitals and nursing note reviewed.  Cardiovascular:     Rate and Rhythm: Normal rate and regular rhythm.     Pulses: Normal pulses.     Heart sounds: Normal heart sounds.  Pulmonary:     Effort: Pulmonary effort is normal.     Breath sounds: Normal breath sounds.  Neurological:  Mental Status: She is alert and oriented to person, place, and time.     No results found for any visits on 03/06/24.    Assessment & Plan:    Problem List Items Addressed This Visit     Class 1 obesity due to excess calories with serious comorbidity and body mass index (BMI) of 30.0 to 30.9 in adult   1-2glasses of wine daily 2cups of coffee daily Occassional unsweet tea or diet soda daily Exercise: walking daily Meals: restaurant 2-3x/week. Snacks at night: popcorn, cheese and crackers, ice cream Wt Readings from Last 3 Encounters:  03/06/24  171 lb (77.6 kg)  09/04/23 167 lb (75.8 kg)  07/02/23 167 lb (75.8 kg)    Advised about need for diet modification and weight training exercise Provided printed information F/up in 3-16months      Elevated BP without diagnosis of hypertension   Home BP 110s-130s/70s-80s BP Readings from Last 3 Encounters:  03/06/24 138/80  09/04/23 120/82  07/02/23 128/74    Maintain DASH diet and daily exercise      GAD (generalized anxiety disorder) - Primary   Stable with lexapro  5mg  daily Use of alprazolam  0.25mg  sparingly, last filled 2023.  Med refill sent      Relevant Medications   ALPRAZolam  (XANAX ) 0.5 MG tablet   escitalopram  (LEXAPRO ) 5 MG tablet   Hyperlipidemia   Reports myalgia with pravastatin  and crestor. Advised to maintain a mediterranean diet and avoid ALCOHOL consumption Stop pravastatin  Repeat lipid panel, CK and CMP today  Consider referral to lipid clinic if abnormal lipid panel      Relevant Orders   Lipid panel   Comprehensive metabolic panel with GFR   CK   Return in about 6 months (around 09/06/2024) for hyperlipidemia (fasting).     Roselie Mood, NP

## 2024-03-06 NOTE — Assessment & Plan Note (Signed)
 1-2glasses of wine daily 2cups of coffee daily Occassional unsweet tea or diet soda daily Exercise: walking daily Meals: restaurant 2-3x/week. Snacks at night: popcorn, cheese and crackers, ice cream Wt Readings from Last 3 Encounters:  03/06/24 171 lb (77.6 kg)  09/04/23 167 lb (75.8 kg)  07/02/23 167 lb (75.8 kg)    Advised about need for diet modification and weight training exercise Provided printed information F/up in 3-38months

## 2024-03-06 NOTE — Assessment & Plan Note (Signed)
 Stable with lexapro  5mg  daily Use of alprazolam  0.25mg  sparingly, last filled 2023.  Med refill sent

## 2024-03-10 ENCOUNTER — Ambulatory Visit: Payer: Self-pay | Admitting: Nurse Practitioner

## 2024-03-26 DIAGNOSIS — R509 Fever, unspecified: Secondary | ICD-10-CM | POA: Diagnosis not present

## 2024-03-26 DIAGNOSIS — R5383 Other fatigue: Secondary | ICD-10-CM | POA: Diagnosis not present

## 2024-04-14 DIAGNOSIS — J320 Chronic maxillary sinusitis: Secondary | ICD-10-CM | POA: Diagnosis not present

## 2024-04-14 DIAGNOSIS — J322 Chronic ethmoidal sinusitis: Secondary | ICD-10-CM | POA: Diagnosis not present

## 2024-04-14 DIAGNOSIS — Z9109 Other allergy status, other than to drugs and biological substances: Secondary | ICD-10-CM | POA: Diagnosis not present

## 2024-06-06 DIAGNOSIS — J069 Acute upper respiratory infection, unspecified: Secondary | ICD-10-CM | POA: Diagnosis not present

## 2024-06-06 DIAGNOSIS — B9689 Other specified bacterial agents as the cause of diseases classified elsewhere: Secondary | ICD-10-CM | POA: Diagnosis not present

## 2024-08-20 ENCOUNTER — Encounter: Payer: Self-pay | Admitting: Nurse Practitioner

## 2024-08-20 ENCOUNTER — Other Ambulatory Visit: Payer: Self-pay | Admitting: Nurse Practitioner

## 2024-08-20 DIAGNOSIS — G43009 Migraine without aura, not intractable, without status migrainosus: Secondary | ICD-10-CM

## 2024-08-20 MED ORDER — PROMETHAZINE HCL 25 MG PO TABS: 25.0000 mg | ORAL_TABLET | Freq: Every day | ORAL | 0 refills | Status: AC | PRN

## 2024-08-20 NOTE — Telephone Encounter (Signed)
 Medication: promethazine  (PHENERGAN ) 25 MG  Directions: Take 1 tablet (25 mg) by mouth daily as needed for nausea and vomiting  Last given: 07/04/23 Number refills: 0 Last o/v: 03/06/24 Follow up: 6 months -09/17/24 Labs: 03/06/24

## 2024-08-20 NOTE — Telephone Encounter (Signed)
 Copied from CRM [id number]. Topic: Clinical - Medication Refill >> Aug 20, 2024 12:53 PM Thersia C wrote: Medication: promethazine  (PHENERGAN ) 25 MG tablet  Has the patient contacted their pharmacy? Yes (Agent: If no, request that the patient contact the pharmacy for the refill. If patient does not wish to contact the pharmacy document the reason why and proceed with request.) (Agent: If yes, when and what did the pharmacy advise?)  This is the patient's preferred pharmacy:  [id number] - [place], [place] - [address] [place] [address] Phone: [phone] Fax: [phone]  Is this the correct pharmacy for this prescription? Yes If no, delete pharmacy and type the correct one.   Has the prescription been filled recently? No  Is the patient out of the medication? Yes  Has the patient been seen for an appointment in the last year OR does the patient have an upcoming appointment? Yes  Can we respond through MyChart? Yes  Agent: Please be advised that Rx refills may take up to 3 business days. We ask that you follow-up with your pharmacy.

## 2024-09-17 ENCOUNTER — Ambulatory Visit: Admitting: Nurse Practitioner
